# Patient Record
Sex: Male | Born: 1945 | Race: White | Hispanic: No | Marital: Married | Smoking: Current every day smoker
Health system: Southern US, Community
[De-identification: ages and names within clinical notes are randomized; demographics above are authoritative.]

## PROBLEM LIST (undated history)

## (undated) DIAGNOSIS — C679 Malignant neoplasm of bladder, unspecified: Secondary | ICD-10-CM

## (undated) DIAGNOSIS — Z720 Tobacco use: Secondary | ICD-10-CM

## (undated) DIAGNOSIS — J449 Chronic obstructive pulmonary disease, unspecified: Secondary | ICD-10-CM

## (undated) HISTORY — DX: Malignant neoplasm of bladder, unspecified: C67.9

---

## 2009-10-06 ENCOUNTER — Ambulatory Visit (HOSPITAL_COMMUNITY): Admission: RE | Admit: 2009-10-06 | Discharge: 2009-10-07 | Payer: Self-pay | Admitting: Urology

## 2009-10-06 ENCOUNTER — Encounter (INDEPENDENT_AMBULATORY_CARE_PROVIDER_SITE_OTHER): Payer: Self-pay | Admitting: Urology

## 2009-10-16 ENCOUNTER — Ambulatory Visit: Payer: Self-pay | Admitting: Oncology

## 2009-10-29 LAB — CBC WITH DIFFERENTIAL/PLATELET
Eosinophils Absolute: 0.2 10*3/uL (ref 0.0–0.5)
MCV: 92 fL (ref 79.3–98.0)
MONO#: 0.9 10*3/uL (ref 0.1–0.9)
MONO%: 9.3 % (ref 0.0–14.0)
NEUT#: 7.1 10*3/uL — ABNORMAL HIGH (ref 1.5–6.5)
RBC: 4.35 10*6/uL (ref 4.20–5.82)
RDW: 14.5 % (ref 11.0–14.6)
WBC: 9.1 10*3/uL (ref 4.0–10.3)
lymph#: 0.9 10*3/uL (ref 0.9–3.3)

## 2009-10-29 LAB — COMPREHENSIVE METABOLIC PANEL
Albumin: 3.6 g/dL (ref 3.5–5.2)
Alkaline Phosphatase: 78 U/L (ref 39–117)
Glucose, Bld: 105 mg/dL — ABNORMAL HIGH (ref 70–99)
Potassium: 4.5 mEq/L (ref 3.5–5.3)
Sodium: 137 mEq/L (ref 135–145)
Total Protein: 7.4 g/dL (ref 6.0–8.3)

## 2009-11-04 LAB — CBC WITH DIFFERENTIAL/PLATELET
BASO%: 0.3 % (ref 0.0–2.0)
EOS%: 0.9 % (ref 0.0–7.0)
Eosinophils Absolute: 0.1 10*3/uL (ref 0.0–0.5)
MCH: 31.8 pg (ref 27.2–33.4)
MCV: 89.7 fL (ref 79.3–98.0)
MONO%: 10.8 % (ref 0.0–14.0)
NEUT#: 9.5 10*3/uL — ABNORMAL HIGH (ref 1.5–6.5)
RBC: 4.65 10*6/uL (ref 4.20–5.82)
RDW: 14.8 % — ABNORMAL HIGH (ref 11.0–14.6)

## 2009-11-04 LAB — COMPREHENSIVE METABOLIC PANEL
ALT: 9 U/L (ref 0–53)
AST: 14 U/L (ref 0–37)
Albumin: 4.2 g/dL (ref 3.5–5.2)
Alkaline Phosphatase: 80 U/L (ref 39–117)
Potassium: 4.2 mEq/L (ref 3.5–5.3)
Sodium: 131 mEq/L — ABNORMAL LOW (ref 135–145)
Total Protein: 7.3 g/dL (ref 6.0–8.3)

## 2009-11-11 LAB — COMPREHENSIVE METABOLIC PANEL
AST: 20 U/L (ref 0–37)
Albumin: 3.8 g/dL (ref 3.5–5.2)
Alkaline Phosphatase: 73 U/L (ref 39–117)
BUN: 17 mg/dL (ref 6–23)
Potassium: 4 mEq/L (ref 3.5–5.3)
Sodium: 130 mEq/L — ABNORMAL LOW (ref 135–145)
Total Bilirubin: 1 mg/dL (ref 0.3–1.2)

## 2009-11-11 LAB — CBC WITH DIFFERENTIAL/PLATELET
EOS%: 1.8 % (ref 0.0–7.0)
LYMPH%: 15.2 % (ref 14.0–49.0)
MCH: 31.5 pg (ref 27.2–33.4)
MCV: 90.3 fL (ref 79.3–98.0)
MONO%: 3.3 % (ref 0.0–14.0)
RBC: 4.24 10*6/uL (ref 4.20–5.82)
RDW: 14.3 % (ref 11.0–14.6)

## 2009-11-16 ENCOUNTER — Ambulatory Visit: Payer: Self-pay | Admitting: Oncology

## 2009-11-16 ENCOUNTER — Inpatient Hospital Stay (HOSPITAL_COMMUNITY): Admission: EM | Admit: 2009-11-16 | Discharge: 2009-11-20 | Payer: Self-pay | Admitting: Emergency Medicine

## 2009-11-27 ENCOUNTER — Ambulatory Visit: Payer: Self-pay | Admitting: Oncology

## 2009-12-02 LAB — CBC WITH DIFFERENTIAL/PLATELET
BASO%: 2.3 % — ABNORMAL HIGH (ref 0.0–2.0)
Basophils Absolute: 0.2 10*3/uL — ABNORMAL HIGH (ref 0.0–0.1)
EOS%: 0.7 % (ref 0.0–7.0)
HGB: 11.1 g/dL — ABNORMAL LOW (ref 13.0–17.1)
MCH: 31 pg (ref 27.2–33.4)
MONO#: 1.2 10*3/uL — ABNORMAL HIGH (ref 0.1–0.9)
RDW: 14.4 % (ref 11.0–14.6)
WBC: 9 10*3/uL (ref 4.0–10.3)
lymph#: 1.9 10*3/uL (ref 0.9–3.3)

## 2009-12-02 LAB — COMPREHENSIVE METABOLIC PANEL
AST: 18 U/L (ref 0–37)
Albumin: 3.7 g/dL (ref 3.5–5.2)
BUN: 21 mg/dL (ref 6–23)
CO2: 26 mEq/L (ref 19–32)
Calcium: 9 mg/dL (ref 8.4–10.5)
Chloride: 103 mEq/L (ref 96–112)
Creatinine, Ser: 1.56 mg/dL — ABNORMAL HIGH (ref 0.40–1.50)
Glucose, Bld: 86 mg/dL (ref 70–99)
Potassium: 4.7 mEq/L (ref 3.5–5.3)

## 2009-12-09 LAB — COMPREHENSIVE METABOLIC PANEL
Albumin: 3.4 g/dL — ABNORMAL LOW (ref 3.5–5.2)
CO2: 23 mEq/L (ref 19–32)
Calcium: 8.2 mg/dL — ABNORMAL LOW (ref 8.4–10.5)
Glucose, Bld: 70 mg/dL (ref 70–99)
Potassium: 4.4 mEq/L (ref 3.5–5.3)
Sodium: 135 mEq/L (ref 135–145)
Total Protein: 6.6 g/dL (ref 6.0–8.3)

## 2009-12-09 LAB — CBC WITH DIFFERENTIAL/PLATELET
Basophils Absolute: 0.1 10*3/uL (ref 0.0–0.1)
Eosinophils Absolute: 0 10*3/uL (ref 0.0–0.5)
HCT: 25.9 % — ABNORMAL LOW (ref 38.4–49.9)
HGB: 9 g/dL — ABNORMAL LOW (ref 13.0–17.1)
MCV: 88.7 fL (ref 79.3–98.0)
NEUT#: 3.7 10*3/uL (ref 1.5–6.5)
NEUT%: 64.3 % (ref 39.0–75.0)
RDW: 14.5 % (ref 11.0–14.6)
lymph#: 1.3 10*3/uL (ref 0.9–3.3)

## 2009-12-16 ENCOUNTER — Encounter (HOSPITAL_COMMUNITY)
Admission: RE | Admit: 2009-12-16 | Discharge: 2010-03-13 | Payer: Self-pay | Source: Home / Self Care | Attending: Oncology | Admitting: Oncology

## 2009-12-16 LAB — COMPREHENSIVE METABOLIC PANEL
Albumin: 3.5 g/dL (ref 3.5–5.2)
BUN: 17 mg/dL (ref 6–23)
CO2: 22 mEq/L (ref 19–32)
Calcium: 7.9 mg/dL — ABNORMAL LOW (ref 8.4–10.5)
Chloride: 106 mEq/L (ref 96–112)
Creatinine, Ser: 1.34 mg/dL (ref 0.40–1.50)
Glucose, Bld: 75 mg/dL (ref 70–99)

## 2009-12-16 LAB — CBC WITH DIFFERENTIAL/PLATELET
BASO%: 1.1 % (ref 0.0–2.0)
Basophils Absolute: 0 10*3/uL (ref 0.0–0.1)
HCT: 19.9 % — ABNORMAL LOW (ref 38.4–49.9)
HGB: 6.9 g/dL — CL (ref 13.0–17.1)
MONO#: 0.2 10*3/uL (ref 0.1–0.9)
NEUT%: 59.6 % (ref 39.0–75.0)
RDW: 14.7 % — ABNORMAL HIGH (ref 11.0–14.6)
WBC: 2.7 10*3/uL — ABNORMAL LOW (ref 4.0–10.3)
lymph#: 0.9 10*3/uL (ref 0.9–3.3)

## 2009-12-16 LAB — TYPE & CROSSMATCH - CHCC

## 2009-12-29 ENCOUNTER — Ambulatory Visit: Payer: Self-pay | Admitting: Oncology

## 2009-12-30 LAB — CBC WITH DIFFERENTIAL/PLATELET
BASO%: 2.5 % — ABNORMAL HIGH (ref 0.0–2.0)
Eosinophils Absolute: 0.2 10*3/uL (ref 0.0–0.5)
HCT: 33.6 % — ABNORMAL LOW (ref 38.4–49.9)
LYMPH%: 19.1 % (ref 14.0–49.0)
MCHC: 33 g/dL (ref 32.0–36.0)
MONO#: 1.8 10*3/uL — ABNORMAL HIGH (ref 0.1–0.9)
NEUT#: 3.5 10*3/uL (ref 1.5–6.5)
Platelets: 591 10*3/uL — ABNORMAL HIGH (ref 140–400)
RBC: 3.6 10*6/uL — ABNORMAL LOW (ref 4.20–5.82)
WBC: 7.1 10*3/uL (ref 4.0–10.3)
lymph#: 1.4 10*3/uL (ref 0.9–3.3)
nRBC: 0 % (ref 0–0)

## 2009-12-30 LAB — COMPREHENSIVE METABOLIC PANEL
ALT: 11 U/L (ref 0–53)
AST: 15 U/L (ref 0–37)
Albumin: 4.1 g/dL (ref 3.5–5.2)
CO2: 23 mEq/L (ref 19–32)
Calcium: 9.4 mg/dL (ref 8.4–10.5)
Chloride: 104 mEq/L (ref 96–112)
Creatinine, Ser: 1.49 mg/dL (ref 0.40–1.50)
Potassium: 4.8 mEq/L (ref 3.5–5.3)
Total Protein: 7 g/dL (ref 6.0–8.3)

## 2010-01-06 LAB — COMPREHENSIVE METABOLIC PANEL
ALT: 26 U/L (ref 0–53)
AST: 19 U/L (ref 0–37)
Alkaline Phosphatase: 66 U/L (ref 39–117)
Creatinine, Ser: 1.39 mg/dL (ref 0.40–1.50)
Sodium: 135 mEq/L (ref 135–145)
Total Bilirubin: 0.5 mg/dL (ref 0.3–1.2)
Total Protein: 7 g/dL (ref 6.0–8.3)

## 2010-01-06 LAB — CBC WITH DIFFERENTIAL/PLATELET
BASO%: 0.8 % (ref 0.0–2.0)
HCT: 31.7 % — ABNORMAL LOW (ref 38.4–49.9)
LYMPH%: 11.6 % — ABNORMAL LOW (ref 14.0–49.0)
MCH: 30.9 pg (ref 27.2–33.4)
MCHC: 34.1 g/dL (ref 32.0–36.0)
MCV: 90.6 fL (ref 79.3–98.0)
MONO#: 1.5 10*3/uL — ABNORMAL HIGH (ref 0.1–0.9)
MONO%: 13.8 % (ref 0.0–14.0)
NEUT%: 73.6 % (ref 39.0–75.0)
Platelets: 320 10*3/uL (ref 140–400)
RBC: 3.5 10*6/uL — ABNORMAL LOW (ref 4.20–5.82)
WBC: 10.7 10*3/uL — ABNORMAL HIGH (ref 4.0–10.3)
nRBC: 0 % (ref 0–0)

## 2010-01-13 LAB — CBC WITH DIFFERENTIAL/PLATELET
Basophils Absolute: 0.1 10*3/uL (ref 0.0–0.1)
EOS%: 0.3 % (ref 0.0–7.0)
Eosinophils Absolute: 0 10*3/uL (ref 0.0–0.5)
HGB: 8.4 g/dL — ABNORMAL LOW (ref 13.0–17.1)
LYMPH%: 34.3 % (ref 14.0–49.0)
MCH: 29.8 pg (ref 27.2–33.4)
MCV: 87.2 fL (ref 79.3–98.0)
MONO%: 14.6 % — ABNORMAL HIGH (ref 0.0–14.0)
NEUT%: 47.9 % (ref 39.0–75.0)
Platelets: 65 10*3/uL — ABNORMAL LOW (ref 140–400)
RDW: 15.5 % — ABNORMAL HIGH (ref 11.0–14.6)

## 2010-02-06 ENCOUNTER — Ambulatory Visit: Payer: Self-pay | Admitting: Oncology

## 2010-02-10 LAB — COMPREHENSIVE METABOLIC PANEL
ALT: 8 U/L (ref 0–53)
Alkaline Phosphatase: 65 U/L (ref 39–117)
CO2: 22 mEq/L (ref 19–32)
Creatinine, Ser: 1.46 mg/dL (ref 0.40–1.50)
Sodium: 136 mEq/L (ref 135–145)
Total Bilirubin: 0.6 mg/dL (ref 0.3–1.2)
Total Protein: 6.7 g/dL (ref 6.0–8.3)

## 2010-02-10 LAB — CBC WITH DIFFERENTIAL/PLATELET
BASO%: 0.4 % (ref 0.0–2.0)
EOS%: 0.6 % (ref 0.0–7.0)
HCT: 30 % — ABNORMAL LOW (ref 38.4–49.9)
LYMPH%: 6.2 % — ABNORMAL LOW (ref 14.0–49.0)
MCH: 31.7 pg (ref 27.2–33.4)
MCHC: 34.5 g/dL (ref 32.0–36.0)
MCV: 91.9 fL (ref 79.3–98.0)
MONO%: 12.5 % (ref 0.0–14.0)
NEUT%: 80.3 % — ABNORMAL HIGH (ref 39.0–75.0)
Platelets: 363 10*3/uL (ref 140–400)
RBC: 3.27 10*6/uL — ABNORMAL LOW (ref 4.20–5.82)
WBC: 11.5 10*3/uL — ABNORMAL HIGH (ref 4.0–10.3)

## 2010-02-23 ENCOUNTER — Inpatient Hospital Stay (HOSPITAL_COMMUNITY)
Admission: RE | Admit: 2010-02-23 | Discharge: 2010-03-01 | Payer: Self-pay | Source: Home / Self Care | Attending: Urology | Admitting: Urology

## 2010-02-23 ENCOUNTER — Encounter (INDEPENDENT_AMBULATORY_CARE_PROVIDER_SITE_OTHER): Payer: Self-pay | Admitting: Urology

## 2010-03-25 ENCOUNTER — Ambulatory Visit: Payer: Self-pay | Admitting: Oncology

## 2010-03-25 ENCOUNTER — Encounter
Admission: RE | Admit: 2010-03-25 | Discharge: 2010-03-25 | Payer: Self-pay | Source: Home / Self Care | Attending: Urology | Admitting: Urology

## 2010-03-27 LAB — COMPREHENSIVE METABOLIC PANEL
ALT: <8 U/L (ref 0–53)
AST: 10 U/L (ref 0–37)
Albumin: 3.8 g/dL (ref 3.5–5.2)
Alkaline Phosphatase: 63 U/L (ref 39–117)
BUN: 22 mg/dL (ref 6–23)
CO2: 21 mEq/L (ref 19–32)
Calcium: 9 mg/dL (ref 8.4–10.5)
Chloride: 107 mEq/L (ref 96–112)
Creatinine, Ser: 1.42 mg/dL (ref 0.40–1.50)
Glucose, Bld: 86 mg/dL (ref 70–99)
Potassium: 4.5 mEq/L (ref 3.5–5.3)
Sodium: 138 mEq/L (ref 135–145)
Total Bilirubin: 0.3 mg/dL (ref 0.3–1.2)
Total Protein: 7 g/dL (ref 6.0–8.3)

## 2010-03-27 LAB — CBC WITH DIFFERENTIAL/PLATELET
BASO%: 0.5 % (ref 0.0–2.0)
Basophils Absolute: 0 10*3/uL (ref 0.0–0.1)
EOS%: 2.6 % (ref 0.0–7.0)
Eosinophils Absolute: 0.2 10*3/uL (ref 0.0–0.5)
HCT: 30.6 % — ABNORMAL LOW (ref 38.4–49.9)
HGB: 10.4 g/dL — ABNORMAL LOW (ref 13.0–17.1)
LYMPH%: 11.3 % — ABNORMAL LOW (ref 14.0–49.0)
MCH: 28.7 pg (ref 27.2–33.4)
MCHC: 33.8 g/dL (ref 32.0–36.0)
MCV: 85.1 fL (ref 79.3–98.0)
MONO#: 0.7 10*3/uL (ref 0.1–0.9)
MONO%: 9.5 % (ref 0.0–14.0)
NEUT#: 5.3 10*3/uL (ref 1.5–6.5)
NEUT%: 76.1 % — ABNORMAL HIGH (ref 39.0–75.0)
Platelets: 344 10*3/uL (ref 140–400)
RBC: 3.6 10*6/uL — ABNORMAL LOW (ref 4.20–5.82)
RDW: 15 % — ABNORMAL HIGH (ref 11.0–14.6)
WBC: 6.9 10*3/uL (ref 4.0–10.3)
lymph#: 0.8 10*3/uL — ABNORMAL LOW (ref 0.9–3.3)

## 2010-03-28 ENCOUNTER — Ambulatory Visit (HOSPITAL_COMMUNITY)
Admission: RE | Admit: 2010-03-28 | Discharge: 2010-03-28 | Payer: Self-pay | Source: Home / Self Care | Attending: Interventional Radiology | Admitting: Interventional Radiology

## 2010-04-04 ENCOUNTER — Other Ambulatory Visit: Payer: Self-pay | Admitting: Oncology

## 2010-04-04 ENCOUNTER — Encounter: Payer: Self-pay | Admitting: Oncology

## 2010-04-04 DIAGNOSIS — C679 Malignant neoplasm of bladder, unspecified: Secondary | ICD-10-CM

## 2010-04-09 ENCOUNTER — Other Ambulatory Visit (HOSPITAL_COMMUNITY): Payer: Self-pay | Admitting: Urology

## 2010-04-09 DIAGNOSIS — C679 Malignant neoplasm of bladder, unspecified: Secondary | ICD-10-CM

## 2010-04-14 ENCOUNTER — Other Ambulatory Visit: Payer: Self-pay | Admitting: Interventional Radiology

## 2010-04-14 DIAGNOSIS — N2889 Other specified disorders of kidney and ureter: Secondary | ICD-10-CM

## 2010-04-20 ENCOUNTER — Other Ambulatory Visit (HOSPITAL_COMMUNITY): Payer: Self-pay

## 2010-04-20 NOTE — Discharge Summary (Signed)
  NAMEKLAUS, Perez                ACCOUNT NO.:  [id number]  MEDICAL RECORD NO.:  [id number]          PATIENT TYPE:  INP  LOCATION:  1435                         FACILITY:  [facility]  PHYSICIAN:  Troy Perez, M.D.  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  PROCEDURE:  Was radical cystoprostatectomy with bilateral pelvic lymph node dissection and ileal loop urinary diversion on February 23, 2010.  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  He was 65 years of age at the time of diagnosis.  The patient had previous problems with urinary retention and also had some mild bilateral hydronephrosis.  His tumor was felt to be advanced and that he might benefit from neoadjuvant chemotherapy which he completed a course of.  Followup imaging did not reveal any evidence of gross metastatic disease or definitive evidence of disease outside his bladder.  He was told to be a candidate for radical cystoprostatectomy.  That procedure was performed on February 23, 2010.  The procedure itself was uncomplicated.  The patient did well and had no immediate issues.  The patient's postoperative course was also relatively uneventful. Postoperative hemoglobin was 9.9.  Renal function was stable.  The patient had a continued ongoing ileus, for this procedure.  The patient's hemoglobin stabilized approximately at 9.2.  The patient was able to be discharged on postoperative day #6.  At that point, his Jackson-Pratt drain had been removed.  He was tolerating a general diet well and having normal bowel activity.  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  The tumor did invade to the muscularis propria and into perivesical soft tissue.  The tumor also involved the prostatic urethra and prostatic gland stroma.  There was no evidence of gross disease with lymph nodes.  DISPOSITION:  The patient was discharged to home.  Home health care will be arranged for teaching with regard to ostomy care.  He will be sent home on oral pain medication and will be instructed to otherwise continue with his current medications.  Followup in our office will be in approximately 7-10 days for staple removal, consideration of the stent removal and clinical reassessment.     Troy Perez, M.D.     DSG/MEDQ  D:  04/06/2010  T:  04/06/2010  Job:  [id number]  Electronically Signed by Barron Alvine M.D. on 04/20/2010 08:14:10 AM

## 2010-04-21 ENCOUNTER — Encounter (HOSPITAL_COMMUNITY)
Admission: RE | Admit: 2010-04-21 | Discharge: 2010-04-21 | Disposition: A | Discharge status: Home / Self Care | Payer: MEDICARE | Source: Ambulatory Visit | Attending: Urology | Admitting: Urology

## 2010-04-21 ENCOUNTER — Encounter (HOSPITAL_COMMUNITY): Payer: Self-pay

## 2010-04-21 ENCOUNTER — Ambulatory Visit (HOSPITAL_COMMUNITY)
Admission: RE | Admit: 2010-04-21 | Discharge: 2010-04-21 | Disposition: A | Discharge status: Home / Self Care | Payer: MEDICARE | Source: Ambulatory Visit | Attending: Urology | Admitting: Urology

## 2010-04-21 DIAGNOSIS — Z932 Ileostomy status: Secondary | ICD-10-CM | POA: Insufficient documentation

## 2010-04-21 DIAGNOSIS — C679 Malignant neoplasm of bladder, unspecified: Secondary | ICD-10-CM | POA: Insufficient documentation

## 2010-04-21 MED ORDER — TECHNETIUM TC 99M MEDRONATE IV KIT: 23.2000 | PACK | Freq: Once | INTRAVENOUS | Status: DC | PRN

## 2010-04-22 ENCOUNTER — Encounter (HOSPITAL_COMMUNITY): Payer: MEDICARE | Attending: Interventional Radiology

## 2010-04-22 DIAGNOSIS — Z01812 Encounter for preprocedural laboratory examination: Secondary | ICD-10-CM | POA: Insufficient documentation

## 2010-04-22 LAB — CROSSMATCH: ABO/RH(D): A POS

## 2010-04-22 LAB — BASIC METABOLIC PANEL
Calcium: 9.3 mg/dL (ref 8.4–10.5)
GFR calc Af Amer: >60 mL/min (ref >60)
GFR calc non Af Amer: 50 mL/min — ABNORMAL LOW (ref >60)
Glucose, Bld: 99 mg/dL (ref 70–99)
Potassium: 4.6 mEq/L (ref 3.5–5.1)
Sodium: 139 mEq/L (ref 135–145)

## 2010-04-22 LAB — CBC
Hemoglobin: 12.8 g/dL — ABNORMAL LOW (ref 13.0–17.0)
MCH: 28.4 pg (ref 26.0–34.0)
MCHC: 33 g/dL (ref 30.0–36.0)
MCV: 86 fL (ref 78.0–100.0)
Platelets: 245 10*3/uL (ref 150–400)
RBC: 4.51 MIL/uL (ref 4.22–5.81)

## 2010-04-22 LAB — PROTIME-INR
INR: 1.03 (ref 0.00–1.49)
Prothrombin Time: 13.7 seconds (ref 11.6–15.2)

## 2010-04-24 ENCOUNTER — Other Ambulatory Visit: Payer: Self-pay | Admitting: Interventional Radiology

## 2010-04-24 ENCOUNTER — Observation Stay (HOSPITAL_COMMUNITY)
Admission: RE | Admit: 2010-04-24 | Discharge: 2010-04-25 | Disposition: A | Discharge status: Home / Self Care | Payer: MEDICARE | Source: Ambulatory Visit | Attending: Interventional Radiology | Admitting: Interventional Radiology

## 2010-04-24 ENCOUNTER — Ambulatory Visit (HOSPITAL_COMMUNITY): Payer: MEDICARE

## 2010-04-24 DIAGNOSIS — J4489 Other specified chronic obstructive pulmonary disease: Secondary | ICD-10-CM | POA: Insufficient documentation

## 2010-04-24 DIAGNOSIS — Z79899 Other long term (current) drug therapy: Secondary | ICD-10-CM | POA: Insufficient documentation

## 2010-04-24 DIAGNOSIS — F17200 Nicotine dependence, unspecified, uncomplicated: Secondary | ICD-10-CM | POA: Insufficient documentation

## 2010-04-24 DIAGNOSIS — N2889 Other specified disorders of kidney and ureter: Secondary | ICD-10-CM

## 2010-04-24 DIAGNOSIS — J449 Chronic obstructive pulmonary disease, unspecified: Secondary | ICD-10-CM | POA: Insufficient documentation

## 2010-04-24 DIAGNOSIS — Z01812 Encounter for preprocedural laboratory examination: Secondary | ICD-10-CM | POA: Insufficient documentation

## 2010-04-24 DIAGNOSIS — N189 Chronic kidney disease, unspecified: Secondary | ICD-10-CM | POA: Insufficient documentation

## 2010-04-24 DIAGNOSIS — Z8551 Personal history of malignant neoplasm of bladder: Secondary | ICD-10-CM | POA: Insufficient documentation

## 2010-04-24 DIAGNOSIS — N269 Renal sclerosis, unspecified: Secondary | ICD-10-CM | POA: Insufficient documentation

## 2010-04-24 DIAGNOSIS — N059 Unspecified nephritic syndrome with unspecified morphologic changes: Principal | ICD-10-CM | POA: Insufficient documentation

## 2010-04-24 MED ORDER — IOHEXOL 300 MG/ML  SOLN: 50.0000 mL | Freq: Once | INTRAMUSCULAR | Status: AC | PRN

## 2010-04-25 ENCOUNTER — Other Ambulatory Visit (HOSPITAL_COMMUNITY): Payer: Self-pay

## 2010-04-25 LAB — BASIC METABOLIC PANEL
BUN: 21 mg/dL (ref 6–23)
CO2: 26 mEq/L (ref 19–32)
Calcium: 8.7 mg/dL (ref 8.4–10.5)
Chloride: 106 mEq/L (ref 96–112)
Creatinine, Ser: 1.41 mg/dL (ref 0.4–1.5)
GFR calc Af Amer: >60 mL/min (ref >60)
GFR calc non Af Amer: 50 mL/min — ABNORMAL LOW (ref >60)
Glucose, Bld: 134 mg/dL — ABNORMAL HIGH (ref 70–99)
Potassium: 4.7 mEq/L (ref 3.5–5.1)
Sodium: 138 mEq/L (ref 135–145)

## 2010-04-25 LAB — CBC
HCT: 32.8 % — ABNORMAL LOW (ref 39.0–52.0)
Hemoglobin: 10.8 g/dL — ABNORMAL LOW (ref 13.0–17.0)
MCH: 28 pg (ref 26.0–34.0)
MCHC: 32.9 g/dL (ref 30.0–36.0)
MCV: 85 fL (ref 78.0–100.0)
Platelets: 203 10*3/uL (ref 150–400)
RBC: 3.86 MIL/uL — ABNORMAL LOW (ref 4.22–5.81)
RDW: 14.4 % (ref 11.5–15.5)
WBC: 8.2 10*3/uL (ref 4.0–10.5)

## 2010-05-06 ENCOUNTER — Other Ambulatory Visit: Payer: Self-pay | Admitting: Interventional Radiology

## 2010-05-06 ENCOUNTER — Other Ambulatory Visit: Payer: Self-pay | Admitting: Oncology

## 2010-05-06 ENCOUNTER — Other Ambulatory Visit (HOSPITAL_COMMUNITY): Payer: Self-pay | Admitting: Interventional Radiology

## 2010-05-06 DIAGNOSIS — N2889 Other specified disorders of kidney and ureter: Secondary | ICD-10-CM

## 2010-05-06 DIAGNOSIS — C679 Malignant neoplasm of bladder, unspecified: Secondary | ICD-10-CM

## 2010-05-25 ENCOUNTER — Other Ambulatory Visit (HOSPITAL_COMMUNITY): Payer: MEDICARE

## 2010-05-25 ENCOUNTER — Other Ambulatory Visit (HOSPITAL_COMMUNITY): Payer: Self-pay

## 2010-05-25 ENCOUNTER — Ambulatory Visit (HOSPITAL_COMMUNITY)
Admission: RE | Admit: 2010-05-25 | Discharge: 2010-05-25 | Disposition: A | Discharge status: Home / Self Care | Payer: MEDICARE | Source: Ambulatory Visit | Attending: Interventional Radiology | Admitting: Interventional Radiology

## 2010-05-25 ENCOUNTER — Other Ambulatory Visit: Payer: Self-pay | Admitting: Oncology

## 2010-05-25 ENCOUNTER — Encounter (HOSPITAL_BASED_OUTPATIENT_CLINIC_OR_DEPARTMENT_OTHER): Payer: Medicare Other | Admitting: Oncology

## 2010-05-25 DIAGNOSIS — C679 Malignant neoplasm of bladder, unspecified: Secondary | ICD-10-CM

## 2010-05-25 DIAGNOSIS — J438 Other emphysema: Secondary | ICD-10-CM | POA: Insufficient documentation

## 2010-05-25 DIAGNOSIS — N289 Disorder of kidney and ureter, unspecified: Secondary | ICD-10-CM | POA: Insufficient documentation

## 2010-05-25 LAB — CBC
HCT: 25.9 % — ABNORMAL LOW (ref 39.0–52.0)
MCH: 29 pg (ref 26.0–34.0)
MCH: 29.5 pg (ref 26.0–34.0)
MCHC: 33 g/dL (ref 30.0–36.0)
MCHC: 33.6 g/dL (ref 30.0–36.0)
Platelets: 187 10*3/uL (ref 150–400)
RDW: 15 % (ref 11.5–15.5)

## 2010-05-25 LAB — CMP (CANCER CENTER ONLY)
Alkaline Phosphatase: 63 U/L (ref 26–84)
CO2: 27 mEq/L (ref 18–33)
Creat: 1.3 mg/dl — ABNORMAL HIGH (ref 0.6–1.2)
Glucose, Bld: 97 mg/dL (ref 73–118)
Sodium: 138 mEq/L (ref 128–145)
Total Bilirubin: 0.5 mg/dl (ref 0.20–1.60)

## 2010-05-25 LAB — GLUCOSE, CAPILLARY

## 2010-05-25 LAB — CBC WITH DIFFERENTIAL/PLATELET
BASO%: 1.1 % (ref 0.0–2.0)
Eosinophils Absolute: 0.2 10*3/uL (ref 0.0–0.5)
HCT: 37.1 % — ABNORMAL LOW (ref 38.4–49.9)
LYMPH%: 15.8 % (ref 14.0–49.0)
MCHC: 34.2 g/dL (ref 32.0–36.0)
MCV: 83.3 fL (ref 79.3–98.0)
MONO#: 0.6 10*3/uL (ref 0.1–0.9)
MONO%: 11.8 % (ref 0.0–14.0)
NEUT%: 67.3 % (ref 39.0–75.0)
Platelets: 307 10*3/uL (ref 140–400)
RBC: 4.46 10*6/uL (ref 4.20–5.82)
WBC: 5.4 10*3/uL (ref 4.0–10.3)

## 2010-05-25 LAB — BASIC METABOLIC PANEL
BUN: 14 mg/dL (ref 6–23)
CO2: 21 mEq/L (ref 19–32)
CO2: 24 mEq/L (ref 19–32)
Calcium: 8.4 mg/dL (ref 8.4–10.5)
Chloride: 106 mEq/L (ref 96–112)
GFR calc Af Amer: 60 mL/min — ABNORMAL LOW (ref >60)
GFR calc non Af Amer: 46 mL/min — ABNORMAL LOW (ref >60)
Glucose, Bld: 139 mg/dL — ABNORMAL HIGH (ref 70–99)
Glucose, Bld: 147 mg/dL — ABNORMAL HIGH (ref 70–99)
Potassium: 4.1 mEq/L (ref 3.5–5.1)
Potassium: 4.7 mEq/L (ref 3.5–5.1)
Sodium: 134 mEq/L — ABNORMAL LOW (ref 135–145)

## 2010-05-25 LAB — CREATININE, FLUID (PLEURAL, PERITONEAL, JP DRAINAGE): Creat, Fluid: 1.4 mg/dL

## 2010-05-25 MED ORDER — IOHEXOL 300 MG/ML  SOLN
100.0000 mL | Freq: Once | INTRAMUSCULAR | Status: AC | PRN
  Administered 2010-05-25: 100 mL via INTRAVENOUS

## 2010-05-26 ENCOUNTER — Encounter (HOSPITAL_BASED_OUTPATIENT_CLINIC_OR_DEPARTMENT_OTHER): Payer: MEDICARE | Admitting: Oncology

## 2010-05-26 DIAGNOSIS — N289 Disorder of kidney and ureter, unspecified: Secondary | ICD-10-CM

## 2010-05-26 DIAGNOSIS — T451X5A Adverse effect of antineoplastic and immunosuppressive drugs, initial encounter: Secondary | ICD-10-CM

## 2010-05-26 DIAGNOSIS — D6481 Anemia due to antineoplastic chemotherapy: Secondary | ICD-10-CM

## 2010-05-26 DIAGNOSIS — F99 Mental disorder, not otherwise specified: Secondary | ICD-10-CM

## 2010-05-26 DIAGNOSIS — C679 Malignant neoplasm of bladder, unspecified: Secondary | ICD-10-CM

## 2010-05-26 LAB — CBC
HCT: 31.3 % — ABNORMAL LOW (ref 39.0–52.0)
HCT: 31.7 % — ABNORMAL LOW (ref 39.0–52.0)
Hemoglobin: 10.5 g/dL — ABNORMAL LOW (ref 13.0–17.0)
Hemoglobin: 10.5 g/dL — ABNORMAL LOW (ref 13.0–17.0)
MCH: 29.6 pg (ref 26.0–34.0)
MCH: 29.7 pg (ref 26.0–34.0)
MCHC: 33.2 g/dL (ref 30.0–36.0)
MCHC: 33.5 g/dL (ref 30.0–36.0)
MCV: 89.1 fL (ref 78.0–100.0)
MCV: 91.6 fL (ref 78.0–100.0)
Platelets: 156 10*3/uL (ref 150–400)
Platelets: 176 10*3/uL (ref 150–400)
RBC: 3.39 MIL/uL — ABNORMAL LOW (ref 4.22–5.81)
RBC: 3.46 MIL/uL — ABNORMAL LOW (ref 4.22–5.81)
RDW: 15.7 % — ABNORMAL HIGH (ref 11.5–15.5)
RDW: 16 % — ABNORMAL HIGH (ref 11.5–15.5)
RDW: 16.4 % — ABNORMAL HIGH (ref 11.5–15.5)
RDW: 16.7 % — ABNORMAL HIGH (ref 11.5–15.5)
WBC: 16.1 10*3/uL — ABNORMAL HIGH (ref 4.0–10.5)
WBC: 8.4 10*3/uL (ref 4.0–10.5)

## 2010-05-26 LAB — BASIC METABOLIC PANEL
BUN: 15 mg/dL (ref 6–23)
BUN: 19 mg/dL (ref 6–23)
CO2: 22 mEq/L (ref 19–32)
CO2: 25 mEq/L (ref 19–32)
Calcium: 7.6 mg/dL — ABNORMAL LOW (ref 8.4–10.5)
Calcium: 8 mg/dL — ABNORMAL LOW (ref 8.4–10.5)
Creatinine, Ser: 1.58 mg/dL — ABNORMAL HIGH (ref 0.4–1.5)
GFR calc non Af Amer: 39 mL/min — ABNORMAL LOW (ref >60)
GFR calc non Af Amer: 44 mL/min — ABNORMAL LOW (ref >60)
Glucose, Bld: 141 mg/dL — ABNORMAL HIGH (ref 70–99)
Glucose, Bld: 159 mg/dL — ABNORMAL HIGH (ref 70–99)
Potassium: 4.4 mEq/L (ref 3.5–5.1)
Sodium: 135 mEq/L (ref 135–145)

## 2010-05-26 LAB — COMPREHENSIVE METABOLIC PANEL
ALT: 18 U/L (ref 0–53)
Alkaline Phosphatase: 70 U/L (ref 39–117)
BUN: 25 mg/dL — ABNORMAL HIGH (ref 6–23)
CO2: 27 mEq/L (ref 19–32)
Chloride: 104 mEq/L (ref 96–112)
GFR calc non Af Amer: 38 mL/min — ABNORMAL LOW (ref >60)
Glucose, Bld: 90 mg/dL (ref 70–99)
Potassium: 4.5 mEq/L (ref 3.5–5.1)
Sodium: 140 mEq/L (ref 135–145)
Total Bilirubin: 0.5 mg/dL (ref 0.3–1.2)

## 2010-05-26 LAB — POCT I-STAT 4, (NA,K, GLUC, HGB,HCT)
Glucose, Bld: 138 mg/dL — ABNORMAL HIGH (ref 70–99)
HCT: 22 % — ABNORMAL LOW (ref 39.0–52.0)
HCT: 25 % — ABNORMAL LOW (ref 39.0–52.0)
Hemoglobin: 7.5 g/dL — ABNORMAL LOW (ref 13.0–17.0)
Sodium: 138 mEq/L (ref 135–145)
Sodium: 138 mEq/L (ref 135–145)

## 2010-05-26 LAB — CROSSMATCH
ABO/RH(D): A POS
Antibody Screen: NEGATIVE
Unit division: 0

## 2010-05-28 LAB — DIFFERENTIAL
Band Neutrophils: 0 % (ref 0–10)
Basophils Absolute: 0 10*3/uL (ref 0.0–0.1)
Basophils Absolute: 0 10*3/uL (ref 0.0–0.1)
Basophils Absolute: 0 10*3/uL (ref 0.0–0.1)
Basophils Absolute: 0 10*3/uL (ref 0.0–0.1)
Basophils Relative: 0 % (ref 0–1)
Basophils Relative: 0 % (ref 0–1)
Eosinophils Relative: 0 % (ref 0–5)
Eosinophils Relative: 1 % (ref 0–5)
Eosinophils Relative: 1 % (ref 0–5)
Lymphocytes Relative: 23 % (ref 12–46)
Lymphocytes Relative: 7 % — ABNORMAL LOW (ref 12–46)
Lymphocytes Relative: 9 % — ABNORMAL LOW (ref 12–46)
Lymphocytes Relative: 9 % — ABNORMAL LOW (ref 12–46)
Lymphs Abs: 0.1 10*3/uL — ABNORMAL LOW (ref 0.7–4.0)
Lymphs Abs: 0.2 10*3/uL — ABNORMAL LOW (ref 0.7–4.0)
Lymphs Abs: 0.3 10*3/uL — ABNORMAL LOW (ref 0.7–4.0)
Lymphs Abs: 0.8 10*3/uL (ref 0.7–4.0)
Monocytes Absolute: 0.1 10*3/uL (ref 0.1–1.0)
Monocytes Absolute: 0.5 10*3/uL (ref 0.1–1.0)
Monocytes Relative: 13 % — ABNORMAL HIGH (ref 3–12)
Neutro Abs: 2.2 10*3/uL (ref 1.7–7.7)
Neutro Abs: 2.4 10*3/uL (ref 1.7–7.7)
Neutro Abs: 2.5 10*3/uL (ref 1.7–7.7)
Neutrophils Relative %: 64 % (ref 43–77)
Neutrophils Relative %: 75 % (ref 43–77)
Neutrophils Relative %: 86 % — ABNORMAL HIGH (ref 43–77)
Promyelocytes Absolute: 0 %
WBC Morphology: INCREASED

## 2010-05-28 LAB — CBC
HCT: 24 % — ABNORMAL LOW (ref 39.0–52.0)
HCT: 24.8 % — ABNORMAL LOW (ref 39.0–52.0)
HCT: 27.1 % — ABNORMAL LOW (ref 39.0–52.0)
Hemoglobin: 8.5 g/dL — ABNORMAL LOW (ref 13.0–17.0)
Hemoglobin: 8.7 g/dL — ABNORMAL LOW (ref 13.0–17.0)
Hemoglobin: 9.7 g/dL — ABNORMAL LOW (ref 13.0–17.0)
MCH: 32.5 pg (ref 26.0–34.0)
MCHC: 35.7 g/dL (ref 30.0–36.0)
MCV: 90.2 fL (ref 78.0–100.0)
MCV: 91.4 fL (ref 78.0–100.0)
MCV: 92.1 fL (ref 78.0–100.0)
MCV: 92.4 fL (ref 78.0–100.0)
MCV: 92.8 fL (ref 78.0–100.0)
Platelets: 107 10*3/uL — ABNORMAL LOW (ref 150–400)
Platelets: 46 10*3/uL — ABNORMAL LOW (ref 150–400)
RBC: 2.57 MIL/uL — ABNORMAL LOW (ref 4.22–5.81)
RBC: 2.6 MIL/uL — ABNORMAL LOW (ref 4.22–5.81)
RBC: 2.68 MIL/uL — ABNORMAL LOW (ref 4.22–5.81)
RBC: 2.97 MIL/uL — ABNORMAL LOW (ref 4.22–5.81)
RDW: 14.5 % (ref 11.5–15.5)
RDW: 14.6 % (ref 11.5–15.5)
WBC: 2.2 10*3/uL — ABNORMAL LOW (ref 4.0–10.5)
WBC: 3.3 10*3/uL — ABNORMAL LOW (ref 4.0–10.5)
WBC: 3.5 10*3/uL — ABNORMAL LOW (ref 4.0–10.5)

## 2010-05-28 LAB — COMPREHENSIVE METABOLIC PANEL
ALT: 45 U/L (ref 0–53)
AST: 52 U/L — ABNORMAL HIGH (ref 0–37)
Alkaline Phosphatase: 52 U/L (ref 39–117)
Alkaline Phosphatase: 61 U/L (ref 39–117)
BUN: 37 mg/dL — ABNORMAL HIGH (ref 6–23)
CO2: 20 mEq/L (ref 19–32)
CO2: 22 mEq/L (ref 19–32)
Calcium: 8.3 mg/dL — ABNORMAL LOW (ref 8.4–10.5)
Chloride: 105 mEq/L (ref 96–112)
Creatinine, Ser: 1.83 mg/dL — ABNORMAL HIGH (ref 0.4–1.5)
GFR calc non Af Amer: 29 mL/min — ABNORMAL LOW (ref >60)
GFR calc non Af Amer: 37 mL/min — ABNORMAL LOW (ref >60)
Glucose, Bld: 226 mg/dL — ABNORMAL HIGH (ref 70–99)
Potassium: 4.2 mEq/L (ref 3.5–5.1)
Sodium: 130 mEq/L — ABNORMAL LOW (ref 135–145)
Total Bilirubin: 0.7 mg/dL (ref 0.3–1.2)

## 2010-05-28 LAB — URINALYSIS, ROUTINE W REFLEX MICROSCOPIC
Ketones, ur: NEGATIVE mg/dL
Nitrite: NEGATIVE
Protein, ur: 100 mg/dL — AB
Urobilinogen, UA: 1 mg/dL (ref 0.0–1.0)

## 2010-05-28 LAB — BASIC METABOLIC PANEL
Chloride: 100 mEq/L (ref 96–112)
Chloride: 110 mEq/L (ref 96–112)
Chloride: 112 mEq/L (ref 96–112)
Creatinine, Ser: 1.59 mg/dL — ABNORMAL HIGH (ref 0.4–1.5)
Creatinine, Ser: 2.02 mg/dL — ABNORMAL HIGH (ref 0.4–1.5)
GFR calc Af Amer: 40 mL/min — ABNORMAL LOW (ref >60)
GFR calc Af Amer: 53 mL/min — ABNORMAL LOW (ref >60)
GFR calc Af Amer: 55 mL/min — ABNORMAL LOW (ref >60)
GFR calc non Af Amer: 44 mL/min — ABNORMAL LOW (ref >60)
GFR calc non Af Amer: 45 mL/min — ABNORMAL LOW (ref >60)
Potassium: 3.7 mEq/L (ref 3.5–5.1)
Sodium: 139 mEq/L (ref 135–145)

## 2010-05-28 LAB — MAGNESIUM: Magnesium: 2.1 mg/dL (ref 1.5–2.5)

## 2010-05-28 LAB — EXPECTORATED SPUTUM ASSESSMENT W GRAM STAIN, RFLX TO RESP C

## 2010-05-28 LAB — CROSSMATCH
ABO/RH(D): A POS
Antibody Screen: NEGATIVE

## 2010-05-28 LAB — CULTURE, BLOOD (ROUTINE X 2)

## 2010-05-28 LAB — URINE MICROSCOPIC-ADD ON

## 2010-05-28 LAB — CULTURE, RESPIRATORY W GRAM STAIN

## 2010-05-28 LAB — ABO/RH: ABO/RH(D): A POS

## 2010-05-30 LAB — BASIC METABOLIC PANEL
BUN: 12 mg/dL (ref 6–23)
BUN: 14 mg/dL (ref 6–23)
CO2: 28 mEq/L (ref 19–32)
Calcium: 8.5 mg/dL (ref 8.4–10.5)
Chloride: 107 mEq/L (ref 96–112)
Chloride: 108 mEq/L (ref 96–112)
Creatinine, Ser: 1.5 mg/dL (ref 0.4–1.5)
Creatinine, Ser: 1.72 mg/dL — ABNORMAL HIGH (ref 0.4–1.5)
Glucose, Bld: 123 mg/dL — ABNORMAL HIGH (ref 70–99)
Glucose, Bld: 91 mg/dL (ref 70–99)
Potassium: 4.4 mEq/L (ref 3.5–5.1)

## 2010-05-30 LAB — CBC
HCT: 38.6 % — ABNORMAL LOW (ref 39.0–52.0)
MCHC: 35.5 g/dL (ref 30.0–36.0)
MCV: 91.3 fL (ref 78.0–100.0)
Platelets: 353 10*3/uL (ref 150–400)
RDW: 13.2 % (ref 11.5–15.5)

## 2010-06-03 ENCOUNTER — Other Ambulatory Visit: Payer: MEDICARE

## 2010-06-03 ENCOUNTER — Ambulatory Visit
Admission: RE | Admit: 2010-06-03 | Discharge: 2010-06-03 | Disposition: A | Discharge status: Home / Self Care | Payer: Medicare Other | Source: Ambulatory Visit | Attending: Interventional Radiology | Admitting: Interventional Radiology

## 2010-06-03 VITALS — BP 105/62 | HR 77 | Temp 98.7°F | Resp 18 | Ht 67.0 in | Wt 138.0 lb

## 2010-06-03 DIAGNOSIS — N2889 Other specified disorders of kidney and ureter: Secondary | ICD-10-CM

## 2010-06-03 NOTE — Progress Notes (Signed)
Pt states that he is doing well.  Experienced some right flank discomfort x few days immediately post RFA.  Denies pain since.  Has resumed ADL's, including golf 2-3 times/week, also has been doing yard work (1 acre in size).   Afebrile.  Denies hematuria.

## 2010-09-02 ENCOUNTER — Encounter (HOSPITAL_BASED_OUTPATIENT_CLINIC_OR_DEPARTMENT_OTHER): Payer: Medicare Other | Admitting: Oncology

## 2010-09-02 ENCOUNTER — Other Ambulatory Visit: Payer: Self-pay | Admitting: Oncology

## 2010-09-02 DIAGNOSIS — N289 Disorder of kidney and ureter, unspecified: Secondary | ICD-10-CM

## 2010-09-02 DIAGNOSIS — T451X5A Adverse effect of antineoplastic and immunosuppressive drugs, initial encounter: Secondary | ICD-10-CM

## 2010-09-02 DIAGNOSIS — F99 Mental disorder, not otherwise specified: Secondary | ICD-10-CM

## 2010-09-02 DIAGNOSIS — C679 Malignant neoplasm of bladder, unspecified: Secondary | ICD-10-CM

## 2010-09-02 DIAGNOSIS — D6481 Anemia due to antineoplastic chemotherapy: Secondary | ICD-10-CM

## 2010-09-02 LAB — COMPREHENSIVE METABOLIC PANEL
AST: 13 U/L (ref 0–37)
Alkaline Phosphatase: 68 U/L (ref 39–117)
Glucose, Bld: 91 mg/dL (ref 70–99)
Sodium: 137 mEq/L (ref 135–145)
Total Bilirubin: 0.5 mg/dL (ref 0.3–1.2)
Total Protein: 7 g/dL (ref 6.0–8.3)

## 2010-09-02 LAB — CBC WITH DIFFERENTIAL/PLATELET
BASO%: 0.7 % (ref 0.0–2.0)
EOS%: 4.6 % (ref 0.0–7.0)
Eosinophils Absolute: 0.3 10*3/uL (ref 0.0–0.5)
LYMPH%: 15.5 % (ref 14.0–49.0)
MCH: 31.1 pg (ref 27.2–33.4)
MCHC: 34.7 g/dL (ref 32.0–36.0)
MCV: 89.4 fL (ref 79.3–98.0)
MONO%: 9.3 % (ref 0.0–14.0)
Platelets: 223 10*3/uL (ref 140–400)
RBC: 4.6 10*6/uL (ref 4.20–5.82)
RDW: 15.2 % — ABNORMAL HIGH (ref 11.0–14.6)

## 2010-10-13 ENCOUNTER — Telehealth: Payer: Self-pay | Admitting: Emergency Medicine

## 2010-10-13 NOTE — Telephone Encounter (Signed)
ERROR

## 2010-10-14 ENCOUNTER — Other Ambulatory Visit: Payer: Self-pay | Admitting: Interventional Radiology

## 2010-10-14 DIAGNOSIS — N2889 Other specified disorders of kidney and ureter: Secondary | ICD-10-CM

## 2010-12-01 ENCOUNTER — Ambulatory Visit (HOSPITAL_COMMUNITY)
Admission: RE | Admit: 2010-12-01 | Discharge: 2010-12-01 | Disposition: A | Discharge status: Home / Self Care | Payer: Medicare Other | Source: Ambulatory Visit | Attending: Oncology | Admitting: Oncology

## 2010-12-01 ENCOUNTER — Other Ambulatory Visit: Payer: Self-pay | Admitting: Oncology

## 2010-12-01 ENCOUNTER — Encounter (HOSPITAL_BASED_OUTPATIENT_CLINIC_OR_DEPARTMENT_OTHER): Payer: Medicare Other | Admitting: Oncology

## 2010-12-01 ENCOUNTER — Encounter (HOSPITAL_COMMUNITY): Payer: Self-pay

## 2010-12-01 DIAGNOSIS — F99 Mental disorder, not otherwise specified: Secondary | ICD-10-CM

## 2010-12-01 DIAGNOSIS — N289 Disorder of kidney and ureter, unspecified: Secondary | ICD-10-CM

## 2010-12-01 DIAGNOSIS — C679 Malignant neoplasm of bladder, unspecified: Secondary | ICD-10-CM

## 2010-12-01 DIAGNOSIS — C649 Malignant neoplasm of unspecified kidney, except renal pelvis: Secondary | ICD-10-CM | POA: Insufficient documentation

## 2010-12-01 DIAGNOSIS — T451X5A Adverse effect of antineoplastic and immunosuppressive drugs, initial encounter: Secondary | ICD-10-CM

## 2010-12-01 DIAGNOSIS — J438 Other emphysema: Secondary | ICD-10-CM | POA: Insufficient documentation

## 2010-12-01 DIAGNOSIS — D6481 Anemia due to antineoplastic chemotherapy: Secondary | ICD-10-CM

## 2010-12-01 DIAGNOSIS — J984 Other disorders of lung: Secondary | ICD-10-CM | POA: Insufficient documentation

## 2010-12-01 LAB — CBC WITH DIFFERENTIAL/PLATELET
Basophils Absolute: 0 10*3/uL (ref 0.0–0.1)
Eosinophils Absolute: 0.2 10*3/uL (ref 0.0–0.5)
LYMPH%: 14.5 % (ref 14.0–49.0)
MCH: 31.5 pg (ref 27.2–33.4)
MCV: 90.5 fL (ref 79.3–98.0)
MONO%: 8.4 % (ref 0.0–14.0)
NEUT#: 5.1 10*3/uL (ref 1.5–6.5)
Platelets: 263 10*3/uL (ref 140–400)
RBC: 4.74 10*6/uL (ref 4.20–5.82)

## 2010-12-01 LAB — CMP (CANCER CENTER ONLY)
BUN, Bld: 22 mg/dL (ref 7–22)
CO2: 26 mEq/L (ref 18–33)
Calcium: 8.7 mg/dL (ref 8.0–10.3)
Chloride: 100 mEq/L (ref 98–108)
Creat: 1.4 mg/dl — ABNORMAL HIGH (ref 0.6–1.2)

## 2010-12-01 MED ORDER — IOHEXOL 300 MG/ML  SOLN
100.0000 mL | Freq: Once | INTRAMUSCULAR | Status: AC | PRN
  Administered 2010-12-01: 100 mL via INTRAVENOUS

## 2010-12-03 ENCOUNTER — Encounter (HOSPITAL_BASED_OUTPATIENT_CLINIC_OR_DEPARTMENT_OTHER): Payer: Medicare Other | Admitting: Oncology

## 2010-12-03 DIAGNOSIS — C679 Malignant neoplasm of bladder, unspecified: Secondary | ICD-10-CM

## 2010-12-03 DIAGNOSIS — Z23 Encounter for immunization: Secondary | ICD-10-CM

## 2010-12-16 ENCOUNTER — Other Ambulatory Visit: Payer: Self-pay | Admitting: Interventional Radiology

## 2010-12-16 ENCOUNTER — Ambulatory Visit
Admission: RE | Admit: 2010-12-16 | Discharge: 2010-12-16 | Disposition: A | Discharge status: Home / Self Care | Payer: Medicare Other | Source: Ambulatory Visit | Attending: Interventional Radiology | Admitting: Interventional Radiology

## 2010-12-16 VITALS — BP 135/75 | HR 68 | Temp 97.6°F | Resp 16 | Ht 68.0 in | Wt 153.0 lb

## 2010-12-16 DIAGNOSIS — N2889 Other specified disorders of kidney and ureter: Secondary | ICD-10-CM

## 2010-12-16 NOTE — Progress Notes (Signed)
Appetite good.  Gained 12 lbs since June 2012.  (Had lost weight during chemotherapy).  Denies hematuria.  Denies pain.  Endurance is good.  Sleeping well.  No complaints.

## 2011-03-18 ENCOUNTER — Other Ambulatory Visit: Payer: Self-pay | Admitting: Interventional Radiology

## 2011-03-18 DIAGNOSIS — N2889 Other specified disorders of kidney and ureter: Secondary | ICD-10-CM

## 2011-03-18 NOTE — Progress Notes (Signed)
Received office notes from [facility]; forwarded to Dr. Clelia Croft.

## 2011-03-20 ENCOUNTER — Telehealth: Payer: Self-pay | Admitting: Oncology

## 2011-03-20 NOTE — Telephone Encounter (Signed)
Talked to pt, gave him appt date for 05/06/11, lab and MD visit

## 2011-04-28 ENCOUNTER — Other Ambulatory Visit: Payer: Medicare Other

## 2011-05-04 ENCOUNTER — Encounter: Payer: Self-pay | Admitting: *Deleted

## 2011-05-06 ENCOUNTER — Ambulatory Visit: Payer: Medicare Other | Admitting: Oncology

## 2011-05-06 ENCOUNTER — Other Ambulatory Visit: Payer: Medicare Other | Admitting: Lab

## 2011-05-11 ENCOUNTER — Other Ambulatory Visit: Payer: Self-pay | Admitting: *Deleted

## 2011-05-11 DIAGNOSIS — C679 Malignant neoplasm of bladder, unspecified: Secondary | ICD-10-CM

## 2011-05-12 ENCOUNTER — Other Ambulatory Visit (HOSPITAL_BASED_OUTPATIENT_CLINIC_OR_DEPARTMENT_OTHER): Payer: Medicare Other | Admitting: Lab

## 2011-05-12 ENCOUNTER — Telehealth: Payer: Self-pay | Admitting: Oncology

## 2011-05-12 ENCOUNTER — Ambulatory Visit (HOSPITAL_BASED_OUTPATIENT_CLINIC_OR_DEPARTMENT_OTHER): Payer: Medicare Other | Admitting: Oncology

## 2011-05-12 VITALS — BP 115/62 | HR 81 | Temp 97.6°F | Ht 68.0 in | Wt 160.0 lb

## 2011-05-12 DIAGNOSIS — C679 Malignant neoplasm of bladder, unspecified: Secondary | ICD-10-CM

## 2011-05-12 DIAGNOSIS — C649 Malignant neoplasm of unspecified kidney, except renal pelvis: Secondary | ICD-10-CM

## 2011-05-12 LAB — CBC WITH DIFFERENTIAL/PLATELET
BASO%: 1.5 % (ref 0.0–2.0)
Basophils Absolute: 0.1 10*3/uL (ref 0.0–0.1)
EOS%: 2.4 % (ref 0.0–7.0)
HCT: 43.1 % (ref 38.4–49.9)
MCH: 31.1 pg (ref 27.2–33.4)
MCHC: 34.3 g/dL (ref 32.0–36.0)
MCV: 90.6 fL (ref 79.3–98.0)
MONO%: 8.5 % (ref 0.0–14.0)
NEUT%: 74.1 % (ref 39.0–75.0)
RDW: 14.6 % (ref 11.0–14.6)
lymph#: 0.9 10*3/uL (ref 0.9–3.3)

## 2011-05-12 LAB — COMPREHENSIVE METABOLIC PANEL
ALT: 12 U/L (ref 0–53)
AST: 15 U/L (ref 0–37)
Alkaline Phosphatase: 59 U/L (ref 39–117)
BUN: 21 mg/dL (ref 6–23)
Calcium: 9 mg/dL (ref 8.4–10.5)
Chloride: 106 mEq/L (ref 96–112)
Creatinine, Ser: 1.43 mg/dL — ABNORMAL HIGH (ref 0.50–1.35)

## 2011-05-12 NOTE — Telephone Encounter (Signed)
[facility] PT APPT SCHEDULE FOR July.

## 2011-05-12 NOTE — Progress Notes (Signed)
Hematology and Oncology Follow Up Visit  Troy Perez [id number] 1945/10/30 66 y.o. 05/12/2011 11:35 AM  CC: Troy Fuller, MD  Troy Perez. Troy Perez, M.D.    Principle Diagnosis: This is a 66 year old gentleman with the following diagnoses:  1. Transitional cell carcinoma of the bladder.  He had a T3 high-grade muscle-invasive tumor diagnosed in August 2011.   2.      Right renal mass, suspicious for renal carcinoma.    Prior Therapy:  1. Status post transurethral resection of bladder tumor, followed by neoadjuvant chemotherapy with 3 cycles of Gemzar and cisplatin.  Therapy concluded in November 2011.  2. The patient underwent a radical cystoprostatectomy on February 23, 2010.  Pathology revealed a 5-cm high-grade urothelial carcinoma, T4 N0 disease.  3. He underwent a percutaneous cryoablation of the right renal mass done in February 2012.  Current therapy: Observation and surveillance.  Interim History:  [name] presents today for a followup visit.  He has continued to do very well without any evidence to suggest recurrent metastatic disease.  He has continued to report no abdominal pain, nausea, no vomiting.  He has continued to have an excellent appetite and weight gain.  Performance status and activity level remains excellent.   Medications: I have reviewed the patient's current medications. Current outpatient prescriptions:Fluticasone-Salmeterol (ADVAIR DISKUS IN), Inhale into the lungs 2 (two) times daily., Disp: , Rfl:   Allergies: No Known Allergies  Past Medical History, Surgical history, Social history, and Family History were reviewed and updated.  Review of Systems: Constitutional:  Negative for fever, chills, night sweats, anorexia, weight loss, pain. Cardiovascular: no chest pain or dyspnea on exertion Respiratory: no cough, shortness of breath, or wheezing Neurological: negative Dermatological: negative ENT: negative Skin: Negative. Gastrointestinal: no  abdominal pain, change in bowel habits, or black or bloody stools Genito-Urinary: no dysuria, trouble voiding, or hematuria Hematological and Lymphatic: negative Breast: negative Musculoskeletal: negative Remaining ROS negative. Physical Exam: Blood pressure 115/62, pulse 81, temperature 97.6 F (36.4 C), temperature source Oral, height 5\' 8"  (1.727 m), weight 160 lb (72.576 kg). ECOG: 0 General appearance: alert Head: Normocephalic, without obvious abnormality, atraumatic Neck: no adenopathy, no carotid bruit, no JVD, supple, symmetrical, trachea midline and thyroid not enlarged, symmetric, no tenderness/mass/nodules Lymph nodes: Cervical, supraclavicular, and axillary nodes normal. Heart:regular rate and rhythm, S1, S2 normal, no murmur, click, rub or gallop Lung:chest clear, no wheezing, rales, normal symmetric air entry Abdomin: soft, non-tender, without masses or organomegaly EXT:no erythema, induration, or nodules   Lab Results: Lab Results  Component Value Date   WBC 6.8 05/12/2011   HGB 14.8 05/12/2011   HCT 43.1 05/12/2011   MCV 90.6 05/12/2011   PLT 253 05/12/2011     Chemistry      Component Value Date/Time   NA 139 12/01/2010 0836   NA 137 09/02/2010 0920   K 4.8* 12/01/2010 0836   K 4.7 09/02/2010 0920   CL 100 12/01/2010 0836   CL 103 09/02/2010 0920   CO2 26 12/01/2010 0836   CO2 26 09/02/2010 0920   BUN 22 12/01/2010 0836   BUN 22 09/02/2010 0920   CREATININE 1.4* 12/01/2010 0836   CREATININE 1.60* 09/02/2010 0920      Component Value Date/Time   CALCIUM 8.7 12/01/2010 0836   CALCIUM 9.0 09/02/2010 0920   ALKPHOS 63 12/01/2010 0836   ALKPHOS 68 09/02/2010 0920   AST 18 12/01/2010 0836   AST 13 09/02/2010 0920   ALT 8 09/02/2010 0920  BILITOT 0.60 12/01/2010 0836   BILITOT 0.5 09/02/2010 0920        Impression and Plan: This is a pleasant 66 year old gentleman with the follow issues:  1. T4 N0 transitional cell carcinoma of the bladder.  He has high-risk features.   At this time, he is at risk of developing metastatic disease.  At this time, I do not really see any evidence of clear-cut metastasis.  He does have a 4-mm nodule in the lung, which could be old inflammatory.  We will continue to monitor it.  2. 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  He is status post cryoablation done with Dr. Fredia Sorrow regarding that for possible local recurrence.  3. His next CT scan scheduled for 05/2011. Follow up will be in 5 months.    [facility], MD 2/27/201311:35 AM

## 2011-05-25 ENCOUNTER — Ambulatory Visit (HOSPITAL_COMMUNITY)
Admission: RE | Admit: 2011-05-25 | Discharge: 2011-05-25 | Disposition: A | Discharge status: Home / Self Care | Payer: Medicare Other | Source: Ambulatory Visit | Attending: Interventional Radiology | Admitting: Interventional Radiology

## 2011-05-25 DIAGNOSIS — N289 Disorder of kidney and ureter, unspecified: Secondary | ICD-10-CM | POA: Insufficient documentation

## 2011-05-25 DIAGNOSIS — C679 Malignant neoplasm of bladder, unspecified: Secondary | ICD-10-CM | POA: Insufficient documentation

## 2011-05-25 DIAGNOSIS — J984 Other disorders of lung: Secondary | ICD-10-CM | POA: Insufficient documentation

## 2011-05-25 DIAGNOSIS — N269 Renal sclerosis, unspecified: Secondary | ICD-10-CM | POA: Insufficient documentation

## 2011-05-25 DIAGNOSIS — N2889 Other specified disorders of kidney and ureter: Secondary | ICD-10-CM

## 2011-05-25 DIAGNOSIS — C649 Malignant neoplasm of unspecified kidney, except renal pelvis: Secondary | ICD-10-CM | POA: Insufficient documentation

## 2011-05-25 MED ORDER — IOHEXOL 300 MG/ML  SOLN
100.0000 mL | Freq: Once | INTRAMUSCULAR | Status: AC | PRN
  Administered 2011-05-25: 100 mL via INTRAVENOUS

## 2011-05-26 ENCOUNTER — Other Ambulatory Visit: Payer: Medicare Other

## 2011-05-26 ENCOUNTER — Other Ambulatory Visit (HOSPITAL_COMMUNITY): Payer: Medicare Other

## 2011-06-08 ENCOUNTER — Ambulatory Visit
Admission: RE | Admit: 2011-06-08 | Discharge: 2011-06-08 | Disposition: A | Discharge status: Home / Self Care | Payer: Medicare Other | Source: Ambulatory Visit | Attending: Interventional Radiology | Admitting: Interventional Radiology

## 2011-06-08 DIAGNOSIS — N2889 Other specified disorders of kidney and ureter: Secondary | ICD-10-CM

## 2011-06-08 NOTE — Progress Notes (Signed)
Denies hematuria or problems w/ urination.  Denies pain associated w/ procedure.  Pt states that he is doing well overall.

## 2011-10-05 ENCOUNTER — Telehealth: Payer: Self-pay | Admitting: *Deleted

## 2011-10-05 ENCOUNTER — Other Ambulatory Visit (HOSPITAL_BASED_OUTPATIENT_CLINIC_OR_DEPARTMENT_OTHER): Payer: Medicare Other

## 2011-10-05 ENCOUNTER — Ambulatory Visit (HOSPITAL_BASED_OUTPATIENT_CLINIC_OR_DEPARTMENT_OTHER): Payer: Medicare Other | Admitting: Oncology

## 2011-10-05 VITALS — BP 129/77 | HR 72 | Temp 98.2°F | Wt 155.3 lb

## 2011-10-05 DIAGNOSIS — C679 Malignant neoplasm of bladder, unspecified: Secondary | ICD-10-CM

## 2011-10-05 LAB — CBC WITH DIFFERENTIAL/PLATELET
BASO%: 1.1 % (ref 0.0–2.0)
EOS%: 3 % (ref 0.0–7.0)
HCT: 43.8 % (ref 38.4–49.9)
LYMPH%: 12.9 % — ABNORMAL LOW (ref 14.0–49.0)
MCH: 30.7 pg (ref 27.2–33.4)
MCHC: 34.3 g/dL (ref 32.0–36.0)
MCV: 89.5 fL (ref 79.3–98.0)
MONO%: 10.8 % (ref 0.0–14.0)
NEUT%: 72.2 % (ref 39.0–75.0)
lymph#: 0.9 10*3/uL (ref 0.9–3.3)

## 2011-10-05 LAB — COMPREHENSIVE METABOLIC PANEL
ALT: 11 U/L (ref 0–53)
AST: 13 U/L (ref 0–37)
Alkaline Phosphatase: 59 U/L (ref 39–117)
Chloride: 105 mEq/L (ref 96–112)
Creatinine, Ser: 1.45 mg/dL — ABNORMAL HIGH (ref 0.50–1.35)
Total Bilirubin: 0.5 mg/dL (ref 0.3–1.2)

## 2011-10-05 NOTE — Progress Notes (Signed)
Hematology and Oncology Follow Up Visit  Troy Perez [id number] [birth year] 66 y.o. 10/05/2011 10:09 AM  CC: Valetta Fuller, MD  Gloriajean Dell. Andrey Campanile, M.D.    Principle Diagnosis: This is a 66 year old gentleman with the following diagnoses:  1. Transitional cell carcinoma of the bladder.  He had a T3 high-grade muscle-invasive tumor diagnosed in August 2011.   2.      Right renal mass, suspicious for renal carcinoma.  Prior Therapy:  1. Status post transurethral resection of bladder tumor, followed by neoadjuvant chemotherapy with 3 cycles of Gemzar and cisplatin.  Therapy concluded in November 2011.  2. The patient underwent a radical cystoprostatectomy on February 23, 2010.  Pathology revealed a 5-cm high-grade urothelial carcinoma, T4 N0 disease.  3. He underwent a percutaneous cryoablation of the right renal mass done in February 2012.  Current therapy: Observation and surveillance.  Interim History:  Troy Perez presents today for a followup visit.  He has continued to do very well without any evidence to suggest recurrent metastatic disease.  He has continued to report no abdominal pain, nausea, no vomiting.  He has continued to have an excellent appetite and weight gain.  Performance status and activity level remains excellent. He reports no urine problems and his vision is excellent after his eye surgery.    Medications: I have reviewed the patient's current medications. Current outpatient prescriptions:Fluticasone-Salmeterol (ADVAIR DISKUS IN), Inhale into the lungs 2 (two) times daily., Disp: , Rfl:   Allergies: No Known Allergies  Past Medical History, Surgical history, Social history, and Family History were reviewed and updated.  Review of Systems: Constitutional:  Negative for fever, chills, night sweats, anorexia, weight loss, pain. Cardiovascular: no chest pain or dyspnea on exertion Respiratory: no cough, shortness of breath, or wheezing Neurological:  negative Dermatological: negative ENT: negative Skin: Negative. Gastrointestinal: no abdominal pain, change in bowel habits, or black or bloody stools Genito-Urinary: no dysuria, trouble voiding, or hematuria Hematological and Lymphatic: negative Breast: negative Musculoskeletal: negative Remaining ROS negative. Physical Exam: Blood pressure 129/77, pulse 72, temperature 98.2 F (36.8 C), temperature source Oral, weight 155 lb 5 oz (70.449 kg). ECOG: 0 General appearance: alert Head: Normocephalic, without obvious abnormality, atraumatic Neck: no adenopathy, no carotid bruit, no JVD, supple, symmetrical, trachea midline and thyroid not enlarged, symmetric, no tenderness/mass/nodules Lymph nodes: Cervical, supraclavicular, and axillary nodes normal. Heart:regular rate and rhythm, S1, S2 normal, no murmur, click, rub or gallop Lung:chest clear, no wheezing, rales, normal symmetric air entry Abdomin: soft, non-tender, without masses or organomegaly EXT:no erythema, induration, or nodules   Lab Results: Lab Results  Component Value Date   WBC 6.7 10/05/2011   HGB 15.0 10/05/2011   HCT 43.8 10/05/2011   MCV 89.5 10/05/2011   PLT 235 10/05/2011    Impression and Plan: This is a pleasant 66 year old gentleman with the follow issues:  1. T4 N0 transitional cell carcinoma of the bladder.  He has high-risk features.  At this time, he is at risk of developing metastatic disease.  At this time, I do not really see any evidence of clear-cut metastasis.  He does have a 4-mm nodule in the lung, which could be old inflammatory.  We will continue to monitor it. [facility]  2. 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  He is status post cryoablation done with Dr. Fredia Sorrow regarding that for possible local recurrence.  3. Follow up will be in  4 months. Sooner if his scan show abnormalities.    [facility], MD 7/23/201310:09 AM

## 2011-10-05 NOTE — Telephone Encounter (Signed)
gave patient appointment for 02-15-2012 starting at 8:30am printed out calendar and gave to the patient

## 2012-02-15 ENCOUNTER — Ambulatory Visit (HOSPITAL_BASED_OUTPATIENT_CLINIC_OR_DEPARTMENT_OTHER): Payer: Medicare Other | Admitting: Oncology

## 2012-02-15 ENCOUNTER — Other Ambulatory Visit (HOSPITAL_BASED_OUTPATIENT_CLINIC_OR_DEPARTMENT_OTHER): Payer: Medicare Other | Admitting: Lab

## 2012-02-15 ENCOUNTER — Telehealth: Payer: Self-pay | Admitting: Oncology

## 2012-02-15 VITALS — BP 116/68 | HR 71 | Temp 98.2°F | Resp 20 | Ht 67.5 in | Wt 158.2 lb

## 2012-02-15 DIAGNOSIS — C679 Malignant neoplasm of bladder, unspecified: Secondary | ICD-10-CM

## 2012-02-15 DIAGNOSIS — C649 Malignant neoplasm of unspecified kidney, except renal pelvis: Secondary | ICD-10-CM

## 2012-02-15 LAB — CBC WITH DIFFERENTIAL/PLATELET
BASO%: 1 % (ref 0.0–2.0)
Basophils Absolute: 0.1 10*3/uL (ref 0.0–0.1)
EOS%: 2.3 % (ref 0.0–7.0)
HCT: 43.3 % (ref 38.4–49.9)
HGB: 15.1 g/dL (ref 13.0–17.1)
LYMPH%: 10.3 % — ABNORMAL LOW (ref 14.0–49.0)
MCH: 31.5 pg (ref 27.2–33.4)
MCHC: 34.8 g/dL (ref 32.0–36.0)
MCV: 90.6 fL (ref 79.3–98.0)
NEUT%: 75.2 % — ABNORMAL HIGH (ref 39.0–75.0)
Platelets: 245 10*3/uL (ref 140–400)

## 2012-02-15 LAB — COMPREHENSIVE METABOLIC PANEL (CC13)
ALT: 16 U/L (ref 0–55)
AST: 14 U/L (ref 5–34)
BUN: 25 mg/dL (ref 7.0–26.0)
Creatinine: 1.5 mg/dL — ABNORMAL HIGH (ref 0.7–1.3)
Total Bilirubin: 0.49 mg/dL (ref 0.20–1.20)

## 2012-02-15 LAB — LACTATE DEHYDROGENASE (CC13): LDH: 146 U/L (ref 125–245)

## 2012-02-15 NOTE — Progress Notes (Signed)
Hematology and Oncology Follow Up Visit  Troy Perez [id number] 07-10-1945 66 y.o. 02/15/2012 9:27 AM  CC: Valetta Fuller, MD  Gloriajean Dell. Andrey Campanile, M.D.    Principle Diagnosis: This is a 66 year old gentleman with the following diagnoses:  1. Transitional cell carcinoma of the bladder.  He had a T3 high-grade muscle-invasive tumor diagnosed in August 2011.   2.      Right renal mass, suspicious for renal carcinoma.  Prior Therapy:  1. Status post transurethral resection of bladder tumor, followed by neoadjuvant chemotherapy with 3 cycles of Gemzar and cisplatin.  Therapy concluded in November 2011.  2. The patient underwent a radical cystoprostatectomy on February 23, 2010.  Pathology revealed a 5-cm high-grade urothelial carcinoma, T4 N0 disease.  3. He underwent a percutaneous cryoablation of the right renal mass done in February 2012.  Current therapy: Observation and surveillance.  Interim History:  Troy Perez presents today for a followup visit.  He has continued to do very well without any evidence to suggest recurrent metastatic disease.  He has continued to report no abdominal pain, nausea, no vomiting.  He has continued to have an excellent appetite and weight gain.  Performance status and activity level remains excellent. No new complaints at this time.    Medications: I have reviewed the patient's current medications. Current outpatient prescriptions:Fluticasone-Salmeterol (ADVAIR DISKUS IN), Inhale into the lungs 2 (two) times daily., Disp: , Rfl:   Allergies: No Known Allergies  Past Medical History, Surgical history, Social history, and Family History were reviewed and updated.  Review of Systems: Constitutional:  Negative for fever, chills, night sweats, anorexia, weight loss, pain. Cardiovascular: no chest pain or dyspnea on exertion Respiratory: no cough, shortness of breath, or wheezing Neurological: negative Dermatological: negative ENT: negative Skin:  Negative. Gastrointestinal: no abdominal pain, change in bowel habits, or black or bloody stools Genito-Urinary: no dysuria, trouble voiding, or hematuria Hematological and Lymphatic: negative Breast: negative Musculoskeletal: negative Remaining ROS negative. Physical Exam: There were no vitals taken for this visit. ECOG: 0 General appearance: alert Head: Normocephalic, without obvious abnormality, atraumatic Neck: no adenopathy, no carotid bruit, no JVD, supple, symmetrical, trachea midline and thyroid not enlarged, symmetric, no tenderness/mass/nodules Lymph nodes: Cervical, supraclavicular, and axillary nodes normal. Heart:regular rate and rhythm, S1, S2 normal, no murmur, click, rub or gallop Lung:chest clear, no wheezing, rales, normal symmetric air entry Abdomin: soft, non-tender, without masses or organomegaly EXT:no erythema, induration, or nodules   Lab Results: Lab Results  Component Value Date   WBC 8.1 02/15/2012   HGB 15.1 02/15/2012   HCT 43.3 02/15/2012   MCV 90.6 02/15/2012   PLT 245 02/15/2012    Impression and Plan: This is a pleasant 66 year old gentleman with the follow issues:  1. T4 N0 transitional cell carcinoma of the bladder.  He has high-risk features.  At this time, he is at risk of developing metastatic disease.  At this time, I do not really see any evidence of clear-cut metastasis. He does have a 4-mm nodule in the lung, which could be old inflammatory.  We will continue to monitor it. He is due for a repeat CT scan in the next few months.  2. 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  He is status post cryoablation done with Dr. Fredia Sorrow regarding that for possible local recurrence.  3. Follow up will be in 6 months. Sooner if his scan show abnormalities.    [facility], MD 12/3/20139:27 AM

## 2012-02-15 NOTE — Telephone Encounter (Signed)
appts made and printed for the pt

## 2012-04-26 ENCOUNTER — Other Ambulatory Visit (HOSPITAL_COMMUNITY): Payer: Self-pay | Admitting: Interventional Radiology

## 2012-04-26 DIAGNOSIS — N2889 Other specified disorders of kidney and ureter: Secondary | ICD-10-CM

## 2012-04-29 ENCOUNTER — Other Ambulatory Visit: Payer: Self-pay

## 2012-05-17 ENCOUNTER — Ambulatory Visit
Admission: RE | Admit: 2012-05-17 | Discharge: 2012-05-17 | Disposition: A | Discharge status: Home / Self Care | Payer: Medicare Other | Source: Ambulatory Visit | Attending: Interventional Radiology | Admitting: Interventional Radiology

## 2012-05-17 DIAGNOSIS — N2889 Other specified disorders of kidney and ureter: Secondary | ICD-10-CM

## 2012-05-17 NOTE — Progress Notes (Signed)
Denies hematuria or any other problems with urination.    Denies pain associated with cryoablation.  Continues to have 6 mo f/u's with Drs Isabel Caprice and Clelia Croft.  Henri Carmell [place], Charity fundraiser

## 2012-05-17 NOTE — Progress Notes (Signed)
Patient ID: Troy Perez, male   DOB: Jul 22, 1945, 67 y.o.   MRN: [id number]  ESTABLISHED PATIENT OFFICE VISIT  Chief Complaint: Post percutaneous cryoablation of a right renal tumor on 04/24/2010.  History: Mr. Troy Perez has been doing well and denies any current symptoms. He has continued to follow up with Dr. Clelia Croft and Dr. Isabel Caprice for surveillance after resection of a transitional cell carcinoma of the bladder.  Review of Systems: No fever, chills, hematuria, dysuria or flank pain. 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  Exam: Vital signs: Blood pressure 131/67, pulse 85, respirations 14, temperature 97.9, oxygen saturation 96% on room air. General: No acute distress. The abdomen: Soft and nontender. No flank tenderness.  Labs: BUN 25, creatinine 1.5 on 02/15/2012  Imaging: Follow-up CT was performed on 03/28/2012 at [facility]. 3 mm left upper lobe pulmonary nodule was again noted. Stable area of rim enhancement is present along the superior margin of the prior right renal cryoablation, measuring approximately 9 mm in diameter. No evidence of recurrent transitional carcinoma in the abdomen or pelvis.  Assessment and Plan: Mr. Troy Perez is doing well. The area of residual enhancement along the superior margin of prior cryoablation is completely unchanged over nearly 2 years. I have recommended continued surveillance and told the patient that I would not currently recommend any type of retreatment in this area or biopsy. The patient will continue to have follow-up restaging CT studies for is transitional cell carcinoma. I told the patient that I will keep monitoring studies and plan to see him in approximately 1 year to reevaluate the area of stable marginal enhancement at the level of previous right renal cryoablation.

## 2012-08-22 ENCOUNTER — Other Ambulatory Visit (HOSPITAL_BASED_OUTPATIENT_CLINIC_OR_DEPARTMENT_OTHER): Payer: Medicare Other | Admitting: Lab

## 2012-08-22 ENCOUNTER — Ambulatory Visit (HOSPITAL_BASED_OUTPATIENT_CLINIC_OR_DEPARTMENT_OTHER): Payer: Medicare Other | Admitting: Oncology

## 2012-08-22 ENCOUNTER — Telehealth: Payer: Self-pay | Admitting: Oncology

## 2012-08-22 VITALS — BP 121/73 | HR 74 | Temp 97.5°F | Resp 18 | Ht 67.0 in | Wt 158.9 lb

## 2012-08-22 DIAGNOSIS — C649 Malignant neoplasm of unspecified kidney, except renal pelvis: Secondary | ICD-10-CM

## 2012-08-22 DIAGNOSIS — C679 Malignant neoplasm of bladder, unspecified: Secondary | ICD-10-CM

## 2012-08-22 LAB — CBC WITH DIFFERENTIAL/PLATELET
Basophils Absolute: 0 10*3/uL (ref 0.0–0.1)
EOS%: 2.3 % (ref 0.0–7.0)
Eosinophils Absolute: 0.2 10*3/uL (ref 0.0–0.5)
HCT: 41.7 % (ref 38.4–49.9)
HGB: 15.2 g/dL (ref 13.0–17.1)
MCH: 32 pg (ref 27.2–33.4)
MCV: 87.7 fL (ref 79.3–98.0)
NEUT#: 5.1 10*3/uL (ref 1.5–6.5)
NEUT%: 76.4 % — ABNORMAL HIGH (ref 39.0–75.0)
RDW: 13.8 % (ref 11.0–14.6)
lymph#: 0.7 10*3/uL — ABNORMAL LOW (ref 0.9–3.3)

## 2012-08-22 LAB — COMPREHENSIVE METABOLIC PANEL (CC13)
AST: 12 U/L (ref 5–34)
Albumin: 3.9 g/dL (ref 3.5–5.0)
BUN: 21.3 mg/dL (ref 7.0–26.0)
CO2: 27 mEq/L (ref 22–29)
Calcium: 9.2 mg/dL (ref 8.4–10.4)
Chloride: 105 mEq/L (ref 98–107)
Creatinine: 1.5 mg/dL — ABNORMAL HIGH (ref 0.7–1.3)
Glucose: 84 mg/dl (ref 70–99)
Potassium: 4.9 mEq/L (ref 3.5–5.1)

## 2012-08-22 NOTE — Progress Notes (Signed)
Hematology and Oncology Follow Up Visit  Troy Perez [id number] August 24, 1945 68 y.o. 08/22/2012 10:11 AM  CC: Troy Fuller, MD  Troy Perez. Andrey Campanile, M.D.    Principle Diagnosis: This is a 67 year old gentleman with the following diagnoses:  1. Transitional cell carcinoma of the bladder.  He had a T3 high-grade muscle-invasive tumor diagnosed in August 2011.   2.      Right renal mass, suspicious for renal carcinoma.  Prior Therapy:  1. Status post transurethral resection of bladder tumor, followed by neoadjuvant chemotherapy with 3 cycles of Gemzar and cisplatin.  Therapy concluded in November 2011.  2. The patient underwent a radical cystoprostatectomy on February 23, 2010.  Pathology revealed a 5-cm high-grade urothelial carcinoma, T4 N0 disease.  3. He underwent a percutaneous cryoablation of the right renal mass done in February 2012.  Current therapy: Observation and surveillance.  Interim History:  Troy Perez presents today for a followup visit.  He has continued to do very well without any evidence to suggest recurrent metastatic disease.  He has continued to report no abdominal pain, nausea, no vomiting.  He has continued to have an excellent appetite and weight gain.  Performance status and activity level remains excellent. He is very active and no decline since his last visit.   Medications: I have reviewed the patient's current medications. Current outpatient prescriptions:Fluticasone-Salmeterol (ADVAIR DISKUS IN), Inhale into the lungs 2 (two) times daily., Disp: , Rfl:   Allergies: No Known Allergies  Past Medical History, Surgical history, Social history, and Family History were reviewed and updated.  Review of Systems: Constitutional:  Negative for fever, chills, night sweats, anorexia, weight loss, pain. Cardiovascular: no chest pain or dyspnea on exertion Respiratory: no cough, shortness of breath, or wheezing Neurological: negative Dermatological: negative ENT:  negative Skin: Negative. Gastrointestinal: no abdominal pain, change in bowel habits, or black or bloody stools Genito-Urinary: no dysuria, trouble voiding, or hematuria Hematological and Lymphatic: negative Breast: negative Musculoskeletal: negative Remaining ROS negative. Physical Exam: Blood pressure 121/73, pulse 74, temperature 97.5 F (36.4 C), temperature source Oral, resp. rate 18, height 5\' 7"  (1.702 m), weight 158 lb 14.4 oz (72.077 kg). ECOG: 0 General appearance: alert Head: Normocephalic, without obvious abnormality, atraumatic Neck: no adenopathy, no carotid bruit, no JVD, supple, symmetrical, trachea midline and thyroid not enlarged, symmetric, no tenderness/mass/nodules Lymph nodes: Cervical, supraclavicular, and axillary nodes normal. Heart:regular rate and rhythm, S1, S2 normal, no murmur, click, rub or gallop Lung:chest clear, no wheezing, rales, normal symmetric air entry Abdomin: soft, non-tender, without masses or organomegaly EXT:no erythema, induration, or nodules   Lab Results: Lab Results  Component Value Date   WBC 6.7 08/22/2012   HGB 15.2 08/22/2012   HCT 41.7 08/22/2012   MCV 87.7 08/22/2012   PLT 220 08/22/2012    Impression and Plan: This is a pleasant 67 year old gentleman with the follow issues:  1. T4 N0 transitional cell carcinoma of the bladder.  He has high-risk features.  At this time, he is at risk of developing metastatic disease.  At this time, I do not really see any evidence of clear-cut metastasis. He does have a 4-mm nodule in the lung, which could be old inflammatory.  We will continue to monitor it. He is due for a repeat CT scan this summer. Last CT scan in 09/2101 was clear.  2. 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  He is status post cryoablation done with Dr. Fredia Sorrow regarding that for possible local recurrence.  3. Follow up will be in 6 months. Sooner if his scan show abnormalities.    [facility], MD 6/10/201410:11 AM

## 2012-08-22 NOTE — Telephone Encounter (Signed)
gv and printed appt sched and avs for pt  °

## 2012-10-18 ENCOUNTER — Other Ambulatory Visit: Payer: Self-pay

## 2012-11-17 ENCOUNTER — Encounter: Payer: Self-pay | Admitting: Urology

## 2013-01-18 ENCOUNTER — Other Ambulatory Visit: Payer: Self-pay

## 2013-02-21 ENCOUNTER — Telehealth: Payer: Self-pay | Admitting: Oncology

## 2013-02-21 ENCOUNTER — Ambulatory Visit (HOSPITAL_BASED_OUTPATIENT_CLINIC_OR_DEPARTMENT_OTHER): Payer: Medicare Other | Admitting: Oncology

## 2013-02-21 ENCOUNTER — Other Ambulatory Visit (HOSPITAL_BASED_OUTPATIENT_CLINIC_OR_DEPARTMENT_OTHER): Payer: Medicare Other

## 2013-02-21 VITALS — BP 128/74 | HR 80 | Temp 98.5°F | Resp 18 | Ht 67.0 in | Wt 162.3 lb

## 2013-02-21 DIAGNOSIS — R911 Solitary pulmonary nodule: Secondary | ICD-10-CM

## 2013-02-21 DIAGNOSIS — C649 Malignant neoplasm of unspecified kidney, except renal pelvis: Secondary | ICD-10-CM

## 2013-02-21 DIAGNOSIS — C679 Malignant neoplasm of bladder, unspecified: Secondary | ICD-10-CM

## 2013-02-21 LAB — CBC WITH DIFFERENTIAL/PLATELET
BASO%: 0.7 % (ref 0.0–2.0)
HCT: 45.4 % (ref 38.4–49.9)
HGB: 15.9 g/dL (ref 13.0–17.1)
LYMPH%: 8 % — ABNORMAL LOW (ref 14.0–49.0)
MCH: 31.4 pg (ref 27.2–33.4)
MONO#: 0.8 10*3/uL (ref 0.1–0.9)
MONO%: 10.2 % (ref 0.0–14.0)
NEUT#: 6.2 10*3/uL (ref 1.5–6.5)
RDW: 13.7 % (ref 11.0–14.6)
WBC: 7.8 10*3/uL (ref 4.0–10.3)
lymph#: 0.6 10*3/uL — ABNORMAL LOW (ref 0.9–3.3)

## 2013-02-21 LAB — COMPREHENSIVE METABOLIC PANEL (CC13)
ALT: 16 U/L (ref 0–55)
Alkaline Phosphatase: 66 U/L (ref 40–150)
Glucose: 100 mg/dl (ref 70–140)
Sodium: 139 mEq/L (ref 136–145)
Total Bilirubin: 0.64 mg/dL (ref 0.20–1.20)
Total Protein: 7.5 g/dL (ref 6.4–8.3)

## 2013-02-21 NOTE — Telephone Encounter (Signed)
gv adn printed papt sched and avs for pt for July 2015

## 2013-02-21 NOTE — Progress Notes (Signed)
Hematology and Oncology Follow Up Visit  Troy Perez [id number] 07/27/1945 67 y.o. 02/21/2013 8:57 AM  CC: Valetta Fuller, MD  Gloriajean Dell. Andrey Campanile, M.D.    Principle Diagnosis: This is a 67 year old gentleman with the following diagnoses:  1. Transitional cell carcinoma of the bladder.  He had a T3 high-grade muscle-invasive tumor diagnosed in August 2011.   2.      Right renal mass, suspicious for renal carcinoma.  Prior Therapy:  1. Status post transurethral resection of bladder tumor, followed by neoadjuvant chemotherapy with 3 cycles of Gemzar and cisplatin.  Therapy concluded in November 2011.  2. The patient underwent a radical cystoprostatectomy on February 23, 2010.  Pathology revealed a 5-cm high-grade urothelial carcinoma, T4 N0 disease.  3. He underwent a percutaneous cryoablation of the right renal mass done in February 2012.  Current therapy: Observation and surveillance.  Interim History:  Mr. Dredge presents today for a followup visit with his wife. Since his last visit, he has continued to do very well without any evidence to suggest recurrent metastatic disease.  He has continued to report no abdominal pain, nausea, no vomiting.  He has continued to have an excellent appetite and weight gain. Performance status and activity level remains excellent. He is very active and no decline since his last visit. He was able to play golf most of the season without any decline. He has not reported any hematuria or dysuria or any genitourinary complaints. Has not reported any constitutional symptoms.  Medications: I have reviewed the patient's current medications. Current outpatient prescriptions:Fluticasone-Salmeterol (ADVAIR DISKUS IN), Inhale into the lungs 2 (two) times daily., Disp: , Rfl:   Allergies: No Known Allergies  Past Medical History, Surgical history, Social history, and Family History were reviewed and updated.  Review of Systems:  Remaining ROS negative. Physical  Exam: Blood pressure 128/74, pulse 80, temperature 98.5 F (36.9 C), temperature source Oral, resp. rate 18, height 5\' 7"  (1.702 m), weight 162 lb 4.8 oz (73.619 kg). ECOG: 0 General appearance: alert Head: Normocephalic, without obvious abnormality, atraumatic Neck: no adenopathy, no carotid bruit, no JVD, supple, symmetrical, trachea midline and thyroid not enlarged, symmetric, no tenderness/mass/nodules Lymph nodes: Cervical, supraclavicular, and axillary nodes normal. Heart:regular rate and rhythm, S1, S2 normal, no murmur, click, rub or gallop Lung:chest clear, no wheezing, rales, normal symmetric air entry Abdomin: soft, non-tender, without masses or organomegaly EXT:no erythema, induration, or nodules   Lab Results: Lab Results  Component Value Date   WBC 7.8 02/21/2013   HGB 15.9 02/21/2013   HCT 45.4 02/21/2013   MCV 89.7 02/21/2013   PLT 230 02/21/2013    Impression and Plan: This is a pleasant 67 year old gentleman with the follow issues:  1. T4 N0 transitional cell carcinoma of the bladder.  He has high-risk features.  At this time, he is at risk of developing metastatic disease.  At this time, I do not really see any evidence of clear-cut metastasis. He does have a 4-mm nodule in the lung, which could be old inflammatory.  We will continue to monitor it. He is due for a repeat CT scan this summer which will be done at Dr. Ellin Goodie  [facility]. 2. 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  He is status post cryoablation done with Dr. Fredia Sorrow regarding that for possible local recurrence.  3. Follow up will be in 6 months. Sooner if his scan show abnormalities.    SHADAD,FIRAS, MD 12/10/20148:57 AM

## 2013-04-18 ENCOUNTER — Other Ambulatory Visit (HOSPITAL_COMMUNITY): Payer: Self-pay | Admitting: Interventional Radiology

## 2013-04-18 DIAGNOSIS — N2889 Other specified disorders of kidney and ureter: Secondary | ICD-10-CM

## 2013-06-05 ENCOUNTER — Inpatient Hospital Stay (HOSPITAL_COMMUNITY): Payer: Medicare HMO

## 2013-06-05 ENCOUNTER — Encounter (HOSPITAL_COMMUNITY): Payer: Self-pay | Admitting: Emergency Medicine

## 2013-06-05 ENCOUNTER — Emergency Department (HOSPITAL_COMMUNITY): Payer: Medicare HMO

## 2013-06-05 ENCOUNTER — Inpatient Hospital Stay (HOSPITAL_COMMUNITY)
Admission: EM | Admit: 2013-06-05 | Discharge: 2013-06-13 | DRG: 871 | Disposition: E | Payer: Medicare HMO | Attending: Pulmonary Disease | Admitting: Pulmonary Disease

## 2013-06-05 DIAGNOSIS — D696 Thrombocytopenia, unspecified: Secondary | ICD-10-CM | POA: Diagnosis not present

## 2013-06-05 DIAGNOSIS — N179 Acute kidney failure, unspecified: Secondary | ICD-10-CM | POA: Diagnosis present

## 2013-06-05 DIAGNOSIS — J9600 Acute respiratory failure, unspecified whether with hypoxia or hypercapnia: Secondary | ICD-10-CM | POA: Diagnosis present

## 2013-06-05 DIAGNOSIS — E872 Acidosis, unspecified: Secondary | ICD-10-CM

## 2013-06-05 DIAGNOSIS — R6520 Severe sepsis without septic shock: Secondary | ICD-10-CM

## 2013-06-05 DIAGNOSIS — J189 Pneumonia, unspecified organism: Secondary | ICD-10-CM

## 2013-06-05 DIAGNOSIS — J441 Chronic obstructive pulmonary disease with (acute) exacerbation: Secondary | ICD-10-CM | POA: Diagnosis present

## 2013-06-05 DIAGNOSIS — G9341 Metabolic encephalopathy: Secondary | ICD-10-CM | POA: Diagnosis present

## 2013-06-05 DIAGNOSIS — Z515 Encounter for palliative care: Secondary | ICD-10-CM

## 2013-06-05 DIAGNOSIS — Z66 Do not resuscitate: Secondary | ICD-10-CM | POA: Diagnosis not present

## 2013-06-05 DIAGNOSIS — I959 Hypotension, unspecified: Secondary | ICD-10-CM | POA: Diagnosis present

## 2013-06-05 DIAGNOSIS — IMO0002 Reserved for concepts with insufficient information to code with codable children: Secondary | ICD-10-CM

## 2013-06-05 DIAGNOSIS — E875 Hyperkalemia: Secondary | ICD-10-CM | POA: Diagnosis not present

## 2013-06-05 DIAGNOSIS — E871 Hypo-osmolality and hyponatremia: Secondary | ICD-10-CM | POA: Diagnosis present

## 2013-06-05 DIAGNOSIS — J9690 Respiratory failure, unspecified, unspecified whether with hypoxia or hypercapnia: Secondary | ICD-10-CM

## 2013-06-05 DIAGNOSIS — A419 Sepsis, unspecified organism: Principal | ICD-10-CM | POA: Diagnosis present

## 2013-06-05 DIAGNOSIS — Z8551 Personal history of malignant neoplasm of bladder: Secondary | ICD-10-CM

## 2013-06-05 DIAGNOSIS — F17200 Nicotine dependence, unspecified, uncomplicated: Secondary | ICD-10-CM | POA: Diagnosis present

## 2013-06-05 DIAGNOSIS — J15212 Pneumonia due to Methicillin resistant Staphylococcus aureus: Secondary | ICD-10-CM | POA: Diagnosis present

## 2013-06-05 DIAGNOSIS — E878 Other disorders of electrolyte and fluid balance, not elsewhere classified: Secondary | ICD-10-CM | POA: Diagnosis present

## 2013-06-05 DIAGNOSIS — N19 Unspecified kidney failure: Secondary | ICD-10-CM

## 2013-06-05 DIAGNOSIS — R6521 Severe sepsis with septic shock: Secondary | ICD-10-CM

## 2013-06-05 DIAGNOSIS — E1169 Type 2 diabetes mellitus with other specified complication: Secondary | ICD-10-CM | POA: Diagnosis present

## 2013-06-05 HISTORY — DX: Tobacco use: Z72.0

## 2013-06-05 HISTORY — DX: Chronic obstructive pulmonary disease, unspecified: J44.9

## 2013-06-05 LAB — CBC WITH DIFFERENTIAL/PLATELET
Basophils Absolute: 0 10*3/uL (ref 0.0–0.1)
Basophils Relative: 0 % (ref 0–1)
EOS ABS: 0 10*3/uL (ref 0.0–0.7)
EOS PCT: 0 % (ref 0–5)
HCT: 46 % (ref 39.0–52.0)
Hemoglobin: 16.2 g/dL (ref 13.0–17.0)
LYMPHS PCT: 7 % — AB (ref 12–46)
Lymphs Abs: 0.5 10*3/uL — ABNORMAL LOW (ref 0.7–4.0)
MCH: 31 pg (ref 26.0–34.0)
MCHC: 35.2 g/dL (ref 30.0–36.0)
MCV: 88 fL (ref 78.0–100.0)
MONO ABS: 1.1 10*3/uL — AB (ref 0.1–1.0)
Monocytes Relative: 15 % — ABNORMAL HIGH (ref 3–12)
NEUTROS PCT: 78 % — AB (ref 43–77)
Neutro Abs: 5.5 10*3/uL (ref 1.7–7.7)
PLATELETS: 240 10*3/uL (ref 150–400)
RBC: 5.23 MIL/uL (ref 4.22–5.81)
RDW: 14.3 % (ref 11.5–15.5)
WBC: 7.1 10*3/uL (ref 4.0–10.5)

## 2013-06-05 LAB — BLOOD GAS, ARTERIAL
Acid-base deficit: 8.1 mmol/L — ABNORMAL HIGH (ref 0.0–2.0)
Acid-base deficit: 10.7 mmol/L — ABNORMAL HIGH (ref 0.0–2.0)
BICARBONATE: 15.5 meq/L — AB (ref 20.0–24.0)
Bicarbonate: 15.4 mEq/L — ABNORMAL LOW (ref 20.0–24.0)
Drawn by: 27407
Drawn by: 38235
FIO2: 0.32 %
O2 Content: 5 L/min
O2 Saturation: 95.8 %
O2 Saturation: 88.9 %
PATIENT TEMPERATURE: 37
PH ART: 7.425 (ref 7.350–7.450)
PH ART: 7.224 — AB (ref 7.350–7.450)
PO2 ART: 73.7 mmHg — AB (ref 80.0–100.0)
Patient temperature: 37
TCO2: 13 mmol/L (ref 0–100)
TCO2: 14 mmol/L (ref 0–100)
pCO2 arterial: 24 mmHg — ABNORMAL LOW (ref 35.0–45.0)
pCO2 arterial: 38.8 mmHg (ref 35.0–45.0)
pO2, Arterial: 63.3 mmHg — ABNORMAL LOW (ref 80.0–100.0)

## 2013-06-05 LAB — BASIC METABOLIC PANEL
BUN: 43 mg/dL — ABNORMAL HIGH (ref 6–23)
CALCIUM: 8.6 mg/dL (ref 8.4–10.5)
CO2: 19 mEq/L (ref 19–32)
Chloride: 92 mEq/L — ABNORMAL LOW (ref 96–112)
Creatinine, Ser: 2.55 mg/dL — ABNORMAL HIGH (ref 0.50–1.35)
GFR calc Af Amer: 28 mL/min — ABNORMAL LOW (ref >90)
GFR, EST NON AFRICAN AMERICAN: 24 mL/min — AB (ref >90)
Glucose, Bld: 182 mg/dL — ABNORMAL HIGH (ref 70–99)
POTASSIUM: 3.8 meq/L (ref 3.7–5.3)
SODIUM: 131 meq/L — AB (ref 137–147)

## 2013-06-05 LAB — HEPATIC FUNCTION PANEL
ALT: 13 U/L (ref 0–53)
AST: 27 U/L (ref 0–37)
Albumin: 2.8 g/dL — ABNORMAL LOW (ref 3.5–5.2)
Alkaline Phosphatase: 48 U/L (ref 39–117)
BILIRUBIN INDIRECT: 0.7 mg/dL (ref 0.3–0.9)
Bilirubin, Direct: 0.4 mg/dL — ABNORMAL HIGH (ref 0.0–0.3)
TOTAL PROTEIN: 6.3 g/dL (ref 6.0–8.3)
Total Bilirubin: 1.1 mg/dL (ref 0.3–1.2)

## 2013-06-05 LAB — PRO B NATRIURETIC PEPTIDE: PRO B NATRI PEPTIDE: 4450 pg/mL — AB (ref 0–125)

## 2013-06-05 LAB — TROPONIN I

## 2013-06-05 LAB — LIPASE, BLOOD: LIPASE: 8 U/L — AB (ref 11–59)

## 2013-06-05 LAB — LACTIC ACID, PLASMA: Lactic Acid, Venous: 5.1 mmol/L — ABNORMAL HIGH (ref 0.5–2.2)

## 2013-06-05 LAB — CBG MONITORING, ED: GLUCOSE-CAPILLARY: 140 mg/dL — AB (ref 70–99)

## 2013-06-05 MED ORDER — ROCURONIUM BROMIDE 50 MG/5ML IV SOLN
INTRAVENOUS | Status: AC
  Filled 2013-06-05: qty 2

## 2013-06-05 MED ORDER — VANCOMYCIN HCL IN DEXTROSE 1-5 GM/200ML-% IV SOLN
1000.0000 mg | Freq: Once | INTRAVENOUS | Status: AC
  Administered 2013-06-05: 1000 mg via INTRAVENOUS
  Filled 2013-06-05: qty 200

## 2013-06-05 MED ORDER — SUCCINYLCHOLINE CHLORIDE 20 MG/ML IJ SOLN
INTRAMUSCULAR | Status: AC
  Filled 2013-06-05: qty 1

## 2013-06-05 MED ORDER — MIDAZOLAM HCL 2 MG/2ML IJ SOLN
INTRAMUSCULAR | Status: AC
  Filled 2013-06-05: qty 2

## 2013-06-05 MED ORDER — SODIUM CHLORIDE 0.9 % IV SOLN
Freq: Once | INTRAVENOUS | Status: AC
  Administered 2013-06-05: 18:00:00 via INTRAVENOUS

## 2013-06-05 MED ORDER — LEVOFLOXACIN IN D5W 500 MG/100ML IV SOLN
500.0000 mg | Freq: Once | INTRAVENOUS | Status: AC
  Administered 2013-06-05: 500 mg via INTRAVENOUS
  Filled 2013-06-05: qty 100

## 2013-06-05 MED ORDER — SODIUM CHLORIDE 0.9 % IV BOLUS (SEPSIS)
1000.0000 mL | Freq: Once | INTRAVENOUS | Status: AC
  Administered 2013-06-05: 1000 mL via INTRAVENOUS

## 2013-06-05 MED ORDER — ASPIRIN 81 MG PO CHEW
324.0000 mg | CHEWABLE_TABLET | Freq: Once | ORAL | Status: AC
  Administered 2013-06-05: 324 mg via ORAL
  Filled 2013-06-05: qty 4

## 2013-06-05 MED ORDER — PROPOFOL 10 MG/ML IV EMUL
5.0000 ug/kg/min | INTRAVENOUS | Status: DC
  Administered 2013-06-05: 25 ug/kg/min via INTRAVENOUS
  Administered 2013-06-06: 5 ug/kg/min via INTRAVENOUS
  Filled 2013-06-05 (×2): qty 100

## 2013-06-05 MED ORDER — LIDOCAINE HCL (CARDIAC) 20 MG/ML IV SOLN
INTRAVENOUS | Status: AC
  Filled 2013-06-05: qty 5

## 2013-06-05 MED ORDER — PIPERACILLIN-TAZOBACTAM 3.375 G IVPB
3.3750 g | Freq: Once | INTRAVENOUS | Status: AC
  Administered 2013-06-05: 3.375 g via INTRAVENOUS
  Filled 2013-06-05: qty 50

## 2013-06-05 MED ORDER — NOREPINEPHRINE BITARTRATE 1 MG/ML IJ SOLN
INTRAMUSCULAR | Status: AC
  Filled 2013-06-05: qty 4

## 2013-06-05 MED ORDER — MAGNESIUM SULFATE 40 MG/ML IJ SOLN
2.0000 g | Freq: Once | INTRAMUSCULAR | Status: DC
  Filled 2013-06-05: qty 50

## 2013-06-05 MED ORDER — SODIUM CHLORIDE 0.9 % IV SOLN
0.0000 ug/h | INTRAVENOUS | Status: DC
  Administered 2013-06-06: 100 ug/h via INTRAVENOUS
  Administered 2013-06-06: 200 ug/h via INTRAVENOUS
  Administered 2013-06-06: 100 ug/h via INTRAVENOUS
  Administered 2013-06-08: 75 ug/h via INTRAVENOUS
  Filled 2013-06-05 (×5): qty 50

## 2013-06-05 MED ORDER — MIDAZOLAM HCL 2 MG/2ML IJ SOLN
2.0000 mg | Freq: Once | INTRAMUSCULAR | Status: AC
  Administered 2013-06-05: 2 mg via INTRAVENOUS

## 2013-06-05 MED ORDER — ETOMIDATE 2 MG/ML IV SOLN
INTRAVENOUS | Status: AC
  Filled 2013-06-05: qty 20

## 2013-06-05 MED ORDER — SODIUM CHLORIDE 0.9 % IV SOLN: INTRAVENOUS | Status: DC

## 2013-06-05 MED ORDER — PROPOFOL 10 MG/ML IV EMUL
INTRAVENOUS | Status: AC
  Filled 2013-06-05: qty 100

## 2013-06-05 MED ORDER — SODIUM CHLORIDE 0.9 % IV SOLN
2.0000 mg/h | INTRAVENOUS | Status: DC
  Filled 2013-06-05: qty 10

## 2013-06-05 MED ORDER — SODIUM CHLORIDE 0.9 % IV SOLN
Freq: Once | INTRAVENOUS | Status: AC
Start: 2013-06-05 — End: 2013-06-05
  Administered 2013-06-05: 19:00:00 via INTRAVENOUS

## 2013-06-05 MED ORDER — MAGNESIUM SULFATE 40 MG/ML IJ SOLN
2.0000 g | Freq: Once | INTRAMUSCULAR | Status: AC
  Administered 2013-06-05: 2 g via INTRAVENOUS

## 2013-06-05 MED ORDER — DEXTROSE 5 % IV SOLN
2.0000 ug/min | INTRAVENOUS | Status: DC
  Administered 2013-06-05: 2 ug/min via INTRAVENOUS
  Administered 2013-06-06: 35 ug/min via INTRAVENOUS
  Filled 2013-06-05 (×2): qty 4

## 2013-06-05 NOTE — ED Notes (Signed)
RN and family at bedside since pt arrival.

## 2013-06-05 NOTE — ED Notes (Signed)
Pt received 20mg  of etomidate and 100 mg of succinylcholine at 2200 for sedation.

## 2013-06-05 NOTE — Progress Notes (Signed)
Patient taken off BIPAP per request and improved condition.  Discussed with MD.  Patient placed back on 4 lpm O2 sat 99%.

## 2013-06-05 NOTE — ED Provider Notes (Signed)
CSN: [id number]     Arrival date & time 05/17/2013  1709 History  This chart was scribed for Troy Essex, MD by Jenne Campus, ED Scribe. This patient was seen in room APA14/APA14 and the patient's care was started at 5:28 PM.   Chief Complaint  Patient presents with  . Shortness of Breath    Level 5 Caveat-Severe Respiratory Distress  The history is provided by the patient, the spouse and a relative. No language interpreter was used.    HPI Comments: Troy Perez is a 68 y.o. male brought in by ambulance with a h/o COPD, who presents to the Emergency Department complaining of gradual onset, constant SOB for the past 2 days with associated cough, chest tightness and lower back pain attributed to coughing. He was seen by his PCP yesterday for the same and was started on prednisone and azithromycin for Bronchitis without improvement. EMS was called tonight when his breathing rapidly worsened over the past 2 hours. He was given albuterol, duoneb and 125 mg solumedrol en route without improvement. He denies any prior episodes of the same and denies wearing O2 at home. He denies any h/o DVT or PE and h/o cardiac problems including MI. He denies any acute CP, upper back pain or leg swelling. He has a h/o bladder CA that was removed with a urostomy 3 years ago with his last chemo tx being 4 years ago. He is CA free currently, last oncology appt was in December 2014. He denies any recent admissions stating that his last admission was for the urostomy 3 years ago.   PCP is Dr. Redmond Pulling.  Past Medical History  Diagnosis Date  . Malignant neoplasm of bladder, part unspecified   . Diabetes mellitus    History reviewed. No pertinent past surgical history. No family history on file. History  Substance Use Topics  . Smoking status: Current Every Day Smoker -- 1.00 packs/day for 52 years  . Smokeless tobacco: Not on file  . Alcohol Use: Not on file    Review of Systems  Unable to perform ROS:  Severe respiratory distress    Allergies  Review of patient's allergies indicates no known allergies.  Home Medications   No current outpatient prescriptions on file. Triage Vitals: BP 90/69  Pulse 134  Temp(Src) 98.6 F (37 C)  Resp 30  Ht 5\' 7"  (1.702 m)  Wt 160 lb (72.576 kg)  BMI 25.05 kg/m2  SpO2 95%  Physical Exam  Nursing note and vitals reviewed. Constitutional: He is oriented to person, place, and time. He appears well-developed and well-nourished. He appears distressed.  HENT:  Head: Normocephalic and atraumatic.  Mouth/Throat: Oropharynx is clear and moist. No oropharyngeal exudate.  Eyes: Conjunctivae and EOM are normal. Pupils are equal, round, and reactive to light.  Neck: Neck supple. No tracheal deviation present.  Cardiovascular: Regular rhythm and normal heart sounds.  Tachycardia present.   No murmur heard. Pulmonary/Chest: He is in respiratory distress (moderate to severe).  Course rhonchi throughout with scattered wheezing. Decreased air movement on the left. Tachypnea with abdominal breathing  Abdominal: Soft. There is no tenderness. There is no rebound and no guarding.  Colostomy present  Musculoskeletal: Normal range of motion. He exhibits no edema and no tenderness.  Intact peripheral pulses, no peripheral edema  Neurological: He is alert and oriented to person, place, and time. No cranial nerve deficit. He exhibits normal muscle tone. Coordination normal.  Skin: Skin is warm. He is diaphoretic.  Psychiatric: He has  a normal mood and affect. His behavior is normal.    ED Course  Procedures (including critical care time)  Medications  norepinephrine (LEVOPHED) 4 mg in dextrose 5 % 250 mL infusion (5 mcg/min Intravenous Rate/Dose Change 06/06/13 0039)  lidocaine (cardiac) 100 mg/725ml (XYLOCAINE) 20 MG/ML injection 2% (not administered)  succinylcholine (ANECTINE) 20 MG/ML injection (not administered)  rocuronium (ZEMURON) 50 MG/5ML injection (not  administered)  etomidate (AMIDATE) 2 MG/ML injection (not administered)  propofol (DIPRIVAN) 10 mg/ml infusion (25 mcg/kg/min  72.6 kg Intravenous New Bag/Given 04-03-2013 2227)  propofol (DIPRIVAN) 10 mg/ml infusion (not administered)  fentaNYL (SUBLIMAZE) 10 mcg/mL in sodium chloride 0.9 % 250 mL infusion (not administered)  midazolam (VERSED) 2 MG/2ML injection (not administered)  pantoprazole (PROTONIX) injection 40 mg (not administered)  heparin injection 5,000 Units (not administered)  albuterol (PROVENTIL) (2.5 MG/3ML) 0.083% nebulizer solution 2.5 mg (not administered)  ipratropium (ATROVENT) nebulizer solution 0.5 mg (not administered)  albuterol (PROVENTIL) (2.5 MG/3ML) 0.083% nebulizer solution 2.5 mg (not administered)  budesonide (PULMICORT) nebulizer solution 0.25 mg (not administered)  vancomycin (VANCOCIN) IVPB 1000 mg/200 mL premix (not administered)  ceFEPIme (MAXIPIME) 1 g in dextrose 5 % 50 mL IVPB (not administered)  levofloxacin (LEVAQUIN) IVPB 500 mg (not administered)  0.9 %  sodium chloride infusion ( Intravenous Stopped 04-03-2013 1842)  aspirin chewable tablet 324 mg (324 mg Oral Given 04-03-2013 1759)  sodium chloride 0.9 % bolus 1,000 mL (0 mLs Intravenous Stopped 04-03-2013 1957)  magnesium sulfate IVPB 2 g 50 mL (0 g Intravenous Stopped 04-03-2013 1851)  vancomycin (VANCOCIN) IVPB 1000 mg/200 mL premix (0 mg Intravenous Stopped 04-03-2013 1929)  piperacillin-tazobactam (ZOSYN) IVPB 3.375 g (0 g Intravenous Stopped 04-03-2013 1847)  0.9 %  sodium chloride infusion ( Intravenous New Bag/Given 04-03-2013 1842)  levofloxacin (LEVAQUIN) IVPB 500 mg (0 mg Intravenous Stopped 04-03-2013 2037)  sodium chloride 0.9 % bolus 1,000 mL (0 mLs Intravenous Stopped 04-03-2013 2037)  midazolam (VERSED) injection 2 mg (2 mg Intravenous Given 04-03-2013 2239)     DIAGNOSTIC STUDIES: Oxygen Saturation is 95% on RA, adequate by my interpretation.    COORDINATION OF CARE: 5:30 PM- cardiac US is negative.    5:34 PM-Discussed treatment plan which includes IV fluids, CXR, CBC panel, CMP and UA with pt at bedside and pt agreed to plan. Pt agrees to intubation and CPR if needed. Preliminary CXR reading in the room shows PNA in left lung.   5:58 PM-Pt rechecked and feels improved on the Bipap. Discussed possible transfer to [facility] for ICU care with pt and family and both agreed.   6:25 PM- Pt rechecked and feels improved on Bipap. HR is improving, in the 110s from 140s. Work of breathing has improved. Pt agrees to central line if necessary.   6:59 PM- Pt rechecked and BP is 91/53 after 2L IV fluids. He is feeling improved since Bipap. Started on O2. Reports minimal left upper rib pain currently.    7:15 PM-Consult complete with Dr. Craige CottaSood, intensivist. Patient case explained and discussed. Dr. Craige CottaSood agrees to admit patient to [facility] ICU for further evaluation and treatment. Call ended at 7:17 PM  9:03 PM- Pt rechecked and appears agitated on the Bipap. Will try pt on Aubrey for comfort. Pt does not clinically appear to need intubation at this time. Ordered repeat CXR and ABG.   9:42 PM- Repeat ABG is lower than original and repeat CXR shows worsening of PNA. Discussed intubation with pt and pt is agreeable. He states that he  feels worse. Dr. Halford Chessman evaluated the pt via camera and agrees with clinical decision to intubate.   Labs Review Labs Reviewed  CBC WITH DIFFERENTIAL - Abnormal; Notable for the following:    Neutrophils Relative % 78 (*)    Lymphocytes Relative 7 (*)    Monocytes Relative 15 (*)    Lymphs Abs 0.5 (*)    Monocytes Absolute 1.1 (*)    All other components within normal limits  BASIC METABOLIC PANEL - Abnormal; Notable for the following:    Sodium 131 (*)    Chloride 92 (*)    Glucose, Bld 182 (*)    BUN 43 (*)    Creatinine, Ser 2.55 (*)    GFR calc non Af Amer 24 (*)    GFR calc Af Amer 28 (*)    All other components within normal limits  BLOOD GAS, ARTERIAL - Abnormal; Notable  for the following:    pCO2 arterial 24.0 (*)    pO2, Arterial 73.7 (*)    Bicarbonate 15.5 (*)    Acid-base deficit 8.1 (*)    All other components within normal limits  LACTIC ACID, PLASMA - Abnormal; Notable for the following:    Lactic Acid, Venous 5.1 (*)    All other components within normal limits  PRO B NATRIURETIC PEPTIDE - Abnormal; Notable for the following:    Pro B Natriuretic peptide (BNP) 4450.0 (*)    All other components within normal limits  HEPATIC FUNCTION PANEL - Abnormal; Notable for the following:    Albumin 2.8 (*)    Bilirubin, Direct 0.4 (*)    All other components within normal limits  LIPASE, BLOOD - Abnormal; Notable for the following:    Lipase 8 (*)    All other components within normal limits  BLOOD GAS, ARTERIAL - Abnormal; Notable for the following:    pH, Arterial 7.224 (*)    pO2, Arterial 63.3 (*)    Bicarbonate 15.4 (*)    Acid-base deficit 10.7 (*)    All other components within normal limits  GLUCOSE, CAPILLARY - Abnormal; Notable for the following:    Glucose-Capillary 138 (*)    All other components within normal limits  CBG MONITORING, ED - Abnormal; Notable for the following:    Glucose-Capillary 140 (*)    All other components within normal limits  CULTURE, BLOOD (ROUTINE X 2)  CULTURE, BLOOD (ROUTINE X 2)  MRSA PCR SCREENING  CULTURE, RESPIRATORY (NON-EXPECTORATED)  TROPONIN I  URINALYSIS, ROUTINE W REFLEX MICROSCOPIC  STREP PNEUMONIAE URINARY ANTIGEN  LEGIONELLA ANTIGEN, URINE  BASIC METABOLIC PANEL  CBC   Imaging Review Dg Chest Portable 1 View  05/23/2013   CLINICAL DATA:  Short of breath, chest pain, history of bladder cancer  EXAM: PORTABLE CHEST - 1 VIEW  COMPARISON:  Prior CT chest/abdomen/ pelvis 09/28/2012  FINDINGS: Dense wedge-shaped peripheral consolidation abuts the lateral pleura of the left hemi thorax. There is additional diffuse interstitial and airspace opacities throughout the left upper lobe. The left lower  lobe appears relatively spared. No evidence of mediastinal shift to suggest volume loss. Cardiac and mediastinal contours are within normal limits. Atherosclerotic calcifications are noted in the transverse aorta. The right lung remains clear. There is a small left-sided pleural effusion. No acute osseous abnormality.  IMPRESSION: Radiographic findings are most consistent with a left upper lobar pneumonia and small parapneumonic effusion.  Metastatic disease and pulmonary infarct/hemorrhage related to pulmonary embolus are considered significantly less likely given the sparing of the  right lung.  Given the prior history of bladder cancer, recommend imaging followup to resolution.   Electronically Signed   By: Jacqulynn Cadet M.D.   On: 05/25/2013 17:47     EKG Interpretation   Date/Time:  Tuesday June 05 2013 17:15:33 EDT Ventricular Rate:  135 PR Interval:  126 QRS Duration: 82 QT Interval:  286 QTC Calculation: 429 R Axis:   95 Text Interpretation:  Sinus tachycardia Right atrial enlargement Rightward  axis Pulmonary disease pattern Nonspecific ST and T wave abnormality  Abnormal ECG When compared with ECG of 16-Nov-2009 16:44, ST no longer  depressed in Lateral leads No significant change was found Confirmed by  Wyvonnia Dusky  MD, [place] [phone]) on 05/14/2013 5:26:48 PM      MDM   Final diagnoses:  Sepsis  Respiratory failure  CAP (community acquired pneumonia)  Renal failure   acute onset of shortness of breath coughing and wheezing for the past 2 days. Treated for COPD exacerbation by PCP yesterday. Patient arrives in respiratory distress, tachycardic to the 130s, tachypnea to 40 with blood pressure 90/69. Endorses some chest tightness. EKG with sinus tachycardia.  Patient placed on BiPAP for work of breathing on arrival. Decreased breath sounds on the left with scattered rhonchi. Nebs, steroids, magnesium given. Chest x-ray shows dense infiltrate in the left with opacification of  left hemithorax.  Bedside ultrasound shows no pericardial effusion or the right heart strain. Lactate 5, Cr 2.5.  Patient started on broad-spectrum antibiotics, blood cultures obtained. ABG shows no CO2 retention but metabolic acidosis. Blood pressure remained soft and 62-70 systolic. Tachycardia the 130s. After 2 L IV fluid, patient's blood pressure has improved to 350-093 systolic. Heart rate improved to 110s. Work of breathing improved on BiPAP. Patient requests removal after period time. He appears tachypneic but comfortable on 4L [place].  Repeat ABG shows worsening acidosis. His work of breathing remains severe with tachypnea but no hypoxia. Blood pressure 818 systolic.  Discussed the patient, patient family, and Dr. Halford Chessman, all parties agree that patient should be intubated for transfer. Patient tolerated intubation well. Transient hypotension to 70s after intubation improved.  Small dose levophed started prior to transfer.    EMERGENCY DEPARTMENT [place] CARDIAC EXAM "Study: Limited Ultrasound of the heart and pericardium"  INDICATIONS:Hypotension, Tachycardia, Unstable Vital Signs and Dyspnea Multiple views of the heart and pericardium are obtained with a multi-frequency probe.  PERFORMED EX:HBZJIR  IMAGES ARCHIVED?: Yes  FINDINGS: No pericardial effusion, Normal contractility and Tamponade physiology absent  LIMITATIONS:  Emergent procedure  VIEWS USED: Subcostal 4 chamber and Parasternal long axis  INTERPRETATION: Cardiac activity present, Pericardial effusioin absent and Cardiac tamponade absent  COMMENT:  No R heart strain, no pericardial effusion  EMERGENT INTUBATION PROCEDURE NOTE INDICATION: respiratory failure  TECHNIQUE: Risks of procedure as well as the alternatives and risks of each were explained to the (patient/caregiver).  Consent for procedure obtained.  After pre-oxygenating the patient for 3 minutes, a modified rapid-sequence induction was performed using  etomidate and succinylcholine with cricoid pressure. Using a Glidscope and 7.88mm cuffed endotracheal tube was placed and secured at 24 mm at the lip.  Placement was confirmed with by auscultation and by CXR.  COMPLICATIONS: None. The patient tolerated the procedure well with no complications. POST PROCEDURE CXR: tube position acceptable   CRITICAL CARE Performed by: Troy Essex, MD Total critical care time: 45 Critical care time was exclusive of separately billable procedures and treating other patients. Critical care was necessary to treat or prevent  imminent or life-threatening deterioration. Critical care was time spent personally by me on the following activities: development of treatment plan with patient and/or surrogate as well as nursing, discussions with consultants, evaluation of patient's response to treatment, examination of patient, obtaining history from patient or surrogate, ordering and performing treatments and interventions, ordering and review of laboratory studies, ordering and review of radiographic studies, pulse oximetry and re-evaluation of patient's condition.    I personally performed the services described in this documentation, which was scribed in my presence. The recorded information has been reviewed and is accurate.     Glynn Octave, MD 06/06/13 [phone]

## 2013-06-05 NOTE — ED Notes (Signed)
Pt given ice chips per EDP request

## 2013-06-05 NOTE — H&P (Signed)
PULMONARY / CRITICAL CARE MEDICINE   Name: Troy Perez MRN: [id number] DOB: 1945-05-24    ADMISSION DATE:  06/02/2013 CONSULTATION DATE:  3/24  REFERRING MD :  Dr. Wyvonnia Dusky PRIMARY SERVICE: PCCM  CHIEF COMPLAINT:  Acute Respiratory Failure   BRIEF PATIENT DESCRIPTION: 68 y/o M with PMH of COPD presented 3/24 with L PNA, progressed to resp fx in [facility] ER, intubated & tx to [facility] for further care.   SIGNIFICANT EVENTS:    3/24 presented to [facility] ER with 48 h hx of increasing cough, SOB, chest tightness and low back pain.  Found to have L PNA, Progressed to resp fx requiring intubation.   STUDIES: 3/24 - CXR >> diffuse interstitial opacities in L lung  LINES / TUBES: OETT 3/24>>>  CULTURES: U. Strep 3/24>>> UA 3/24>>>3-6 wbc, mod leuk's, neg nitrate, many bacteria Sputum 3/24>>> BCx2 3/24>>>  ANTIBIOTICS: Vanc 3/24 >>  Levoflox 3/24 >>  Cefepime 3/24 >>   HISTORY OF PRESENT ILLNESS:  68 y/o M with PMH of DM, bladder cancer s/p urostomy in 2012 with last chemo in 2011 (reportedly cancer free with last ONC appt in 02/2013) and COPD presented 3/24 to [facility] ER on 3/24 with progressive cough, increased shortness of breath, chest tightness and low back pain from coughing.  He was seen by his PCP on 3/23 for similar complaints and treated with prednisone & azithromycin without improvement.  Patient activated EMS afternoon of 3/24 with approximately 2 hours of worsening symptoms. EMS treated the patient with bronchodilators and 125 mg of solumedrol without improvement.  Patient presented to the ER in respiratory distress - diaphoretic with associated tachycardia.  Initial CXR noted L diffuse interstitial opacities.  He was placed on bipap with improvement in symptoms.  He later became agitated and was taken off bipap.  Follow up ABG at that time noted worsening of oxygenation & acidosis.  Decision was made to intubate patient prior to transfer.  Patient was intubated in ER & tx to [facility]  for further evaluation.  Other work up notes:  Na 131, sr Cr 2.55 (up from baseline of ~1.4), glucose of 182, albumin 2.8, troponin < 0.30, proBNP 4450, lactate of 5.1, wbc 7.1, platelets 240 and hgb 16.2.      PAST MEDICAL HISTORY :  Past Medical History  Diagnosis Date  . Malignant neoplasm of bladder, part unspecified   . Diabetes mellitus    History reviewed. No pertinent past surgical history. Prior to Admission medications   Medication Sig Start Date End Date Taking? Authorizing Provider  acetaminophen (TYLENOL) 500 MG tablet Take 1,000 mg by mouth once as needed.   Yes Historical Provider, MD  azithromycin (ZITHROMAX) 250 MG tablet Take 250-500 mg by mouth See admin instructions. Two tablet tablets (500mg  total) by mouth on day 1, then take one tablet daily (250mg  total) every day for 4 days thereafter 06/04/13  Yes Historical Provider, MD  benzonatate (TESSALON) 100 MG capsule Take 100 mg by mouth 3 (three) times daily as needed. For cough 05/11/13  Yes Historical Provider, MD  Fluticasone-Salmeterol (ADVAIR) 250-50 MCG/DOSE AEPB Inhale 1 puff into the lungs 2 (two) times daily.   Yes Historical Provider, MD  predniSONE (DELTASONE) 20 MG tablet Take 20-40 mg by mouth daily. 9 day supply starting on 06/04/2013 06/04/13  Yes Historical Provider, MD  vitamin C (ASCORBIC ACID) 500 MG tablet Take 500 mg by mouth daily.   Yes Historical Provider, MD   No Known Allergies  FAMILY  HISTORY:  No family history on file.  SOCIAL HISTORY:  reports that he has been smoking.  He does not have any smokeless tobacco history on file. His alcohol and drug histories are not on file.  REVIEW OF SYSTEMS:  Unable to complete as pt is on vent.  Information obtained from previous medical documentation.   SUBJECTIVE:   VITAL SIGNS: Temp:  [97.4 F (36.3 C)-100.1 F (37.8 C)] 97.4 F (36.3 C) (03/24 2215) Pulse Rate:  [113-134] 113 (03/24 2229) Resp:  [22-34] 22 (03/24 2224) BP: (68-110)/(45-69) 80/58  mmHg (03/24 2229) SpO2:  [95 %-100 %] 99 % (03/24 2215) FiO2 (%):  [40 %-50 %] 50 % (03/24 2218) Weight:  [160 lb (72.576 kg)] 160 lb (72.576 kg) (03/24 1714)  HEMODYNAMICS:    VENTILATOR SETTINGS: Vent Mode:  [-] PRVC FiO2 (%):  [40 %-50 %] 50 % Set Rate:  [20 bmp] 20 bmp Vt Set:  [500 mL] 500 mL PEEP:  [5 cmH20] 5 cmH20 Plateau Pressure:  [21 cmH20] 21 cmH20  INTAKE / OUTPUT: Intake/Output   None     PHYSICAL EXAMINATION: General:  Intubated, sedated Neuro:  MAEs, no focal deficits HEENT: NCAT, EOMI. PERRL Cardiovascular:  RRR s  Lungs: diffuse coarse rhonchi, no wheezes noted Abdomen:  Soft, NT, NABS Ext: warm, no edema  LABS:  CBC  Recent Labs Lab 06/23/13 1818  WBC 7.1  HGB 16.2  HCT 46.0  PLT 240   Coag's No results found for this basename: APTT, INR,  in the last 168 hours BMET  Recent Labs Lab 06-23-13 1818  NA 131*  K 3.8  CL 92*  CO2 19  BUN 43*  CREATININE 2.55*  GLUCOSE 182*   Electrolytes  Recent Labs Lab 06-23-13 1818  CALCIUM 8.6   Sepsis Markers  Recent Labs Lab 06/23/2013 1818  LATICACIDVEN 5.1*   ABG  Recent Labs Lab 06-23-13 1733 2013/06/23 2104  PHART 7.425 7.224*  PCO2ART 24.0* 38.8  PO2ART 73.7* 63.3*   Liver Enzymes  Recent Labs Lab 06/23/2013 1818  AST 27  ALT 13  ALKPHOS 48  BILITOT 1.1  ALBUMIN 2.8*   Cardiac Enzymes  Recent Labs Lab 23-Jun-2013 1818  TROPONINI <0.30  PROBNP 4450.0*   Glucose  Recent Labs Lab 06-23-2013 1811  GLUCAP 140*    Imaging Dg Chest Portable 1 View  06/23/13   CLINICAL DATA:  Short of breath, chest pain, history of bladder cancer  EXAM: PORTABLE CHEST - 1 VIEW  COMPARISON:  Prior CT chest/abdomen/ pelvis 09/28/2012  FINDINGS: Dense wedge-shaped peripheral consolidation abuts the lateral pleura of the left hemi thorax. There is additional diffuse interstitial and airspace opacities throughout the left upper lobe. The left lower lobe appears relatively spared. No  evidence of mediastinal shift to suggest volume loss. Cardiac and mediastinal contours are within normal limits. Atherosclerotic calcifications are noted in the transverse aorta. The right lung remains clear. There is a small left-sided pleural effusion. No acute osseous abnormality.  IMPRESSION: Radiographic findings are most consistent with a left upper lobar pneumonia and small parapneumonic effusion.  Metastatic disease and pulmonary infarct/hemorrhage related to pulmonary embolus are considered significantly less likely given the sparing of the right lung.  Given the prior history of bladder cancer, recommend imaging followup to resolution.   Electronically Signed   By: Jacqulynn Cadet M.D.   On: 06-23-2013 17:47   Dg Chest Port 1v Same Day  Jun 23, 2013   CLINICAL DATA:  Shortness of breath, intubation.  EXAM:  PORTABLE CHEST - 1 VIEW SAME DAY  COMPARISON:  DG CHEST PORT 1VSAME DAY dated 05/23/2013 2117 hr  FINDINGS: Interval intubation, distal tip projects 4.4 cm above the carina. Similar interstitial and alveolar airspace opacities in left lung, left costophrenic angle is incompletely imaged. Right lung remains clear with mild interstitial prominence. Cardiothymic silhouette is nonsuspicious. No pneumothorax in the included view of the left lung.  IMPRESSION: Endotracheal tube tip projects 4.4 cm above the carina.  Stable appearance of left chest suggesting pneumonia. Recommend follow-up chest radiograph to verify improvement.   Electronically Signed   By: Elon Alas   On: 06/04/2013 22:41   Dg Chest Port 1v Same Day  05/18/2013   CLINICAL DATA:  Shortness of breath.  Removed from BiPAP.  EXAM: PORTABLE CHEST - 1 VIEW SAME DAY  COMPARISON:  05/26/2013  FINDINGS: Unchanged appearance of dense consolidation in the left mid and upper lung. Unchanged appearance of a small peripheral lucency at the left base which is likely related to accessory fissure or scarring based on stability and previous CT  imaging. There is a small left pleural effusion. The right lung remains well aerated. No cardiomegaly.  IMPRESSION: 1. Unchanged lung aeration after after cessation of BiPAP. 2. Unchanged, extensive left pneumonia.   Electronically Signed   By: Jorje Guild M.D.   On: 05/22/2013 21:58    ASSESSMENT / PLAN:  PULMONARY A: Acute Respiratory Failure  Severe PNA COPD P:   -full vent support, 8cc/kg -f/u CXR / ABG -scheduled bronchodilators -hold home advair -daily WUA / SBT  CARDIOVASCULAR A:  Septic Shock P:  -repeat lactic acid on arrival -levophed for MAP > 65  RENAL A:   Anion Gap Acidosis - elevated lactate Acute Kidney Injury Hx of Bladder CA s/p Urostomy  P:   Monitor BMET intermittently Monitor I/Os Correct electrolytes as indicated  GASTROINTESTINAL A:   No acute issues P:   -NPO -TF per protocol 3/25  HEMATOLOGIC A:   No issues P:  DVT px: SQ heparin Monitor CBC intermittently Transfuse per usual ICU guidelines  INFECTIOUS A:   Severe CAP  Severe sepsis H/O pseudomonas PNA P:   Micro and abx as above  ENDOCRINE A:   DM 2  P:   CBGs/SSI q 4 hrs  NEUROLOGIC A:   Acute Encephalopathy, septic P:   Propofol gtt Fentanyl gtt  Noe Gens, NP-C  Pulmonary & Critical Care Pgr: [phone] or [phone]    I have personally obtained a history, examined the patient, evaluated laboratory and imaging results, formulated the assessment and plan and placed orders.  CRITICAL CARE: The patient is critically ill with multiple organ systems failure and requires high complexity decision making for assessment and support, frequent evaluation and titration of therapies, application of advanced monitoring technologies and extensive interpretation of multiple databases. Critical Care Time devoted to patient care services described in this note is 45 minutes.   Merton Border, MD ; [facility] [phone].  After 5:30 PM or weekends, call  [phone]  06/07/2013, 11:11 PM

## 2013-06-05 NOTE — ED Notes (Signed)
Pt c/o sob and generalized aches since Sunday night. Pt seen by pcp yesterday-dx with Bronchitis and started on prednisone and Advair. Pt reports increasing sob and chest tightness today. Pt given albuterol and duoneb, and 125 solumedrol iv by EMS in route. nad noted.

## 2013-06-06 ENCOUNTER — Inpatient Hospital Stay (HOSPITAL_COMMUNITY): Payer: Medicare HMO

## 2013-06-06 DIAGNOSIS — R6521 Severe sepsis with septic shock: Secondary | ICD-10-CM

## 2013-06-06 DIAGNOSIS — N179 Acute kidney failure, unspecified: Secondary | ICD-10-CM

## 2013-06-06 DIAGNOSIS — A419 Sepsis, unspecified organism: Secondary | ICD-10-CM | POA: Diagnosis present

## 2013-06-06 DIAGNOSIS — J9600 Acute respiratory failure, unspecified whether with hypoxia or hypercapnia: Secondary | ICD-10-CM

## 2013-06-06 DIAGNOSIS — J189 Pneumonia, unspecified organism: Secondary | ICD-10-CM | POA: Insufficient documentation

## 2013-06-06 DIAGNOSIS — R6520 Severe sepsis without septic shock: Secondary | ICD-10-CM | POA: Diagnosis present

## 2013-06-06 LAB — URINALYSIS, ROUTINE W REFLEX MICROSCOPIC
Bilirubin Urine: NEGATIVE
Glucose, UA: NEGATIVE mg/dL
Ketones, ur: NEGATIVE mg/dL
NITRITE: NEGATIVE
PH: 6 (ref 5.0–8.0)
Protein, ur: 100 mg/dL — AB
SPECIFIC GRAVITY, URINE: 1.022 (ref 1.005–1.030)
Urobilinogen, UA: 1 mg/dL (ref 0.0–1.0)

## 2013-06-06 LAB — POCT I-STAT 3, ART BLOOD GAS (G3+)
Acid-base deficit: 13 mmol/L — ABNORMAL HIGH (ref 0.0–2.0)
Acid-base deficit: 9 mmol/L — ABNORMAL HIGH (ref 0.0–2.0)
BICARBONATE: 16 meq/L — AB (ref 20.0–24.0)
BICARBONATE: 19.4 meq/L — AB (ref 20.0–24.0)
O2 SAT: 91 %
O2 Saturation: 99 %
PCO2 ART: 52.6 mmHg — AB (ref 35.0–45.0)
PH ART: 7.148 — AB (ref 7.350–7.450)
Patient temperature: 99.3
TCO2: 17 mmol/L (ref 0–100)
TCO2: 21 mmol/L (ref 0–100)
pCO2 arterial: 46.3 mmHg — ABNORMAL HIGH (ref 35.0–45.0)
pH, Arterial: 7.177 — CL (ref 7.350–7.450)
pO2, Arterial: 175 mmHg — ABNORMAL HIGH (ref 80.0–100.0)
pO2, Arterial: 77 mmHg — ABNORMAL LOW (ref 80.0–100.0)

## 2013-06-06 LAB — BASIC METABOLIC PANEL WITH GFR
BUN: 47 mg/dL — ABNORMAL HIGH (ref 6–23)
CO2: 18 meq/L — ABNORMAL LOW (ref 19–32)
Calcium: 7.4 mg/dL — ABNORMAL LOW (ref 8.4–10.5)
Chloride: 97 meq/L (ref 96–112)
Creatinine, Ser: 2.45 mg/dL — ABNORMAL HIGH (ref 0.50–1.35)
GFR calc Af Amer: 29 mL/min — ABNORMAL LOW
GFR calc non Af Amer: 25 mL/min — ABNORMAL LOW
Glucose, Bld: 176 mg/dL — ABNORMAL HIGH (ref 70–99)
Potassium: 4.4 meq/L (ref 3.7–5.3)
Sodium: 134 meq/L — ABNORMAL LOW (ref 137–147)

## 2013-06-06 LAB — CBC
HCT: 42.3 % (ref 39.0–52.0)
Hemoglobin: 14.9 g/dL (ref 13.0–17.0)
MCH: 31.6 pg (ref 26.0–34.0)
MCHC: 35.2 g/dL (ref 30.0–36.0)
MCV: 89.8 fL (ref 78.0–100.0)
Platelets: 212 K/uL (ref 150–400)
RBC: 4.71 MIL/uL (ref 4.22–5.81)
RDW: 15 % (ref 11.5–15.5)
WBC: 4.1 K/uL (ref 4.0–10.5)

## 2013-06-06 LAB — LEGIONELLA ANTIGEN, URINE: LEGIONELLA ANTIGEN, URINE: NEGATIVE

## 2013-06-06 LAB — GLUCOSE, CAPILLARY
GLUCOSE-CAPILLARY: 138 mg/dL — AB (ref 70–99)
GLUCOSE-CAPILLARY: 173 mg/dL — AB (ref 70–99)
GLUCOSE-CAPILLARY: 98 mg/dL (ref 70–99)
Glucose-Capillary: 131 mg/dL — ABNORMAL HIGH (ref 70–99)
Glucose-Capillary: 91 mg/dL (ref 70–99)

## 2013-06-06 LAB — PHOSPHORUS: Phosphorus: 6.3 mg/dL — ABNORMAL HIGH (ref 2.3–4.6)

## 2013-06-06 LAB — CARBOXYHEMOGLOBIN
Carboxyhemoglobin: 1.1 % (ref 0.5–1.5)
Methemoglobin: 0.8 % (ref 0.0–1.5)
O2 SAT: 69.9 %
Total hemoglobin: 15.1 g/dL (ref 13.5–18.0)

## 2013-06-06 LAB — MRSA PCR SCREENING: MRSA by PCR: POSITIVE — AB

## 2013-06-06 LAB — URINE MICROSCOPIC-ADD ON

## 2013-06-06 LAB — STREP PNEUMONIAE URINARY ANTIGEN: STREP PNEUMO URINARY ANTIGEN: NEGATIVE

## 2013-06-06 LAB — LACTIC ACID, PLASMA: Lactic Acid, Venous: 3 mmol/L — ABNORMAL HIGH (ref 0.5–2.2)

## 2013-06-06 LAB — MAGNESIUM: Magnesium: 2.2 mg/dL (ref 1.5–2.5)

## 2013-06-06 MED ORDER — BIOTENE DRY MOUTH MT LIQD
15.0000 mL | Freq: Two times a day (BID) | OROMUCOSAL | Status: DC
  Administered 2013-06-06 – 2013-06-08 (×6): 15 mL via OROMUCOSAL

## 2013-06-06 MED ORDER — HYDROCORTISONE NA SUCCINATE PF 100 MG IJ SOLR
50.0000 mg | Freq: Four times a day (QID) | INTRAMUSCULAR | Status: DC
Start: 2013-06-06 — End: 2013-06-09
  Administered 2013-06-06 – 2013-06-08 (×11): 50 mg via INTRAVENOUS
  Filled 2013-06-06 (×16): qty 1

## 2013-06-06 MED ORDER — PRO-STAT SUGAR FREE PO LIQD
30.0000 mL | Freq: Two times a day (BID) | ORAL | Status: DC
  Filled 2013-06-06 (×2): qty 30

## 2013-06-06 MED ORDER — VECURONIUM BROMIDE 10 MG IV SOLR
10.0000 mg | Freq: Once | INTRAVENOUS | Status: AC
  Administered 2013-06-06: 10 mg via INTRAVENOUS
  Filled 2013-06-06: qty 10

## 2013-06-06 MED ORDER — NOREPINEPHRINE BITARTRATE 1 MG/ML IJ SOLN
0.0000 ug/min | INTRAVENOUS | Status: DC
  Administered 2013-06-06 (×3): 50 ug/min via INTRAVENOUS
  Administered 2013-06-06: 60 ug/min via INTRAVENOUS
  Administered 2013-06-08: 50 ug/min via INTRAVENOUS
  Administered 2013-06-08: 12 ug/min via INTRAVENOUS
  Administered 2013-06-08: 50 ug/min via INTRAVENOUS
  Filled 2013-06-06 (×8): qty 16

## 2013-06-06 MED ORDER — FAMOTIDINE IN NACL 20-0.9 MG/50ML-% IV SOLN
20.0000 mg | INTRAVENOUS | Status: DC
  Administered 2013-06-06 – 2013-06-08 (×3): 20 mg via INTRAVENOUS
  Filled 2013-06-06 (×4): qty 50

## 2013-06-06 MED ORDER — CISATRACURIUM BOLUS VIA INFUSION
10.0000 mg | Freq: Once | INTRAVENOUS | Status: AC
  Administered 2013-06-06: 10 mg via INTRAVENOUS
  Filled 2013-06-06: qty 10

## 2013-06-06 MED ORDER — PHENYLEPHRINE HCL 10 MG/ML IJ SOLN
30.0000 ug/min | INTRAVENOUS | Status: DC
  Administered 2013-06-06: 80 ug/min via INTRAVENOUS
  Filled 2013-06-06 (×2): qty 1

## 2013-06-06 MED ORDER — SODIUM CHLORIDE 0.9 % IV SOLN
3.0000 ug/kg/min | INTRAVENOUS | Status: DC
  Administered 2013-06-06 – 2013-06-07 (×2): 3 ug/kg/min via INTRAVENOUS
  Administered 2013-06-08: 2 ug/kg/min via INTRAVENOUS
  Filled 2013-06-06 (×3): qty 20

## 2013-06-06 MED ORDER — STERILE WATER FOR INJECTION IV SOLN
INTRAVENOUS | Status: DC
  Administered 2013-06-06: 03:00:00 via INTRAVENOUS
  Filled 2013-06-06 (×3): qty 850

## 2013-06-06 MED ORDER — SODIUM BICARBONATE 8.4 % IV SOLN
INTRAVENOUS | Status: AC
  Filled 2013-06-06: qty 100

## 2013-06-06 MED ORDER — VITAL HIGH PROTEIN PO LIQD
1000.0000 mL | ORAL | Status: DC
  Filled 2013-06-06 (×2): qty 1000

## 2013-06-06 MED ORDER — ALBUTEROL SULFATE (2.5 MG/3ML) 0.083% IN NEBU: 2.5000 mg | INHALATION_SOLUTION | RESPIRATORY_TRACT | Status: DC | PRN

## 2013-06-06 MED ORDER — IPRATROPIUM BROMIDE 0.02 % IN SOLN
0.5000 mg | Freq: Four times a day (QID) | RESPIRATORY_TRACT | Status: DC
  Administered 2013-06-06 (×2): 0.5 mg via RESPIRATORY_TRACT
  Filled 2013-06-06 (×2): qty 2.5

## 2013-06-06 MED ORDER — SODIUM BICARBONATE 8.4 % IV SOLN
100.0000 meq | Freq: Once | INTRAVENOUS | Status: AC
  Administered 2013-06-06: 100 meq via INTRAVENOUS
  Filled 2013-06-06: qty 100

## 2013-06-06 MED ORDER — SODIUM BICARBONATE 8.4 % IV SOLN
100.0000 meq | Freq: Once | INTRAVENOUS | Status: AC
  Administered 2013-06-06: 100 meq via INTRAVENOUS

## 2013-06-06 MED ORDER — CHLORHEXIDINE GLUCONATE 0.12 % MT SOLN: 15.0000 mL | Freq: Two times a day (BID) | OROMUCOSAL | Status: DC

## 2013-06-06 MED ORDER — HEPARIN SODIUM (PORCINE) 5000 UNIT/ML IJ SOLN
5000.0000 [IU] | Freq: Three times a day (TID) | INTRAMUSCULAR | Status: DC
  Administered 2013-06-06 – 2013-06-08 (×10): 5000 [IU] via SUBCUTANEOUS
  Filled 2013-06-06 (×14): qty 1

## 2013-06-06 MED ORDER — VASOPRESSIN 20 UNIT/ML IJ SOLN
0.0300 [IU]/min | INTRAVENOUS | Status: DC
  Administered 2013-06-06: 0.03 [IU]/min via INTRAVENOUS
  Filled 2013-06-06 (×2): qty 2.5

## 2013-06-06 MED ORDER — CHLORHEXIDINE GLUCONATE 0.12 % MT SOLN
15.0000 mL | Freq: Two times a day (BID) | OROMUCOSAL | Status: DC
  Administered 2013-06-06 – 2013-06-08 (×6): 15 mL via OROMUCOSAL
  Filled 2013-06-06 (×4): qty 15

## 2013-06-06 MED ORDER — BUDESONIDE 0.25 MG/2ML IN SUSP
0.2500 mg | Freq: Four times a day (QID) | RESPIRATORY_TRACT | Status: DC
  Administered 2013-06-06 – 2013-06-08 (×11): 0.25 mg via RESPIRATORY_TRACT
  Filled 2013-06-06 (×17): qty 2

## 2013-06-06 MED ORDER — VANCOMYCIN HCL IN DEXTROSE 1-5 GM/200ML-% IV SOLN
1000.0000 mg | INTRAVENOUS | Status: DC
  Administered 2013-06-06: 1000 mg via INTRAVENOUS
  Filled 2013-06-06: qty 200

## 2013-06-06 MED ORDER — ARTIFICIAL TEARS OP OINT
1.0000 | TOPICAL_OINTMENT | Freq: Three times a day (TID) | OPHTHALMIC | Status: DC
Start: 2013-06-06 — End: 2013-06-09
  Administered 2013-06-06 – 2013-06-08 (×7): 1 via OPHTHALMIC
  Filled 2013-06-06 (×2): qty 3.5

## 2013-06-06 MED ORDER — SODIUM CHLORIDE 0.9 % IV BOLUS (SEPSIS)
1000.0000 mL | Freq: Once | INTRAVENOUS | Status: AC
  Administered 2013-06-06: 1000 mL via INTRAVENOUS

## 2013-06-06 MED ORDER — BUDESONIDE 0.25 MG/2ML IN SUSP
0.2500 mg | Freq: Four times a day (QID) | RESPIRATORY_TRACT | Status: DC
  Administered 2013-06-06: 0.25 mg via RESPIRATORY_TRACT
  Filled 2013-06-06 (×2): qty 2

## 2013-06-06 MED ORDER — INSULIN ASPART 100 UNIT/ML ~~LOC~~ SOLN
0.0000 [IU] | SUBCUTANEOUS | Status: DC
  Administered 2013-06-06: 3 [IU] via SUBCUTANEOUS

## 2013-06-06 MED ORDER — VITAL AF 1.2 CAL PO LIQD
1000.0000 mL | ORAL | Status: DC
  Administered 2013-06-06 – 2013-06-07 (×2): 1000 mL
  Filled 2013-06-06 (×4): qty 1000

## 2013-06-06 MED ORDER — ALBUTEROL SULFATE (2.5 MG/3ML) 0.083% IN NEBU
2.5000 mg | INHALATION_SOLUTION | Freq: Four times a day (QID) | RESPIRATORY_TRACT | Status: DC
  Administered 2013-06-06 (×2): 2.5 mg via RESPIRATORY_TRACT
  Filled 2013-06-06 (×2): qty 3

## 2013-06-06 MED ORDER — DEXTROSE 5 % IV SOLN
1.0000 g | Freq: Every day | INTRAVENOUS | Status: DC
  Administered 2013-06-06 – 2013-06-07 (×3): 1 g via INTRAVENOUS
  Filled 2013-06-06 (×4): qty 1

## 2013-06-06 MED ORDER — SODIUM CHLORIDE 0.9 % IV BOLUS (SEPSIS)
500.0000 mL | INTRAVENOUS | Status: DC | PRN
Start: 2013-06-06 — End: 2013-06-09
  Administered 2013-06-06: 500 mL via INTRAVENOUS

## 2013-06-06 MED ORDER — PANTOPRAZOLE SODIUM 40 MG IV SOLR
40.0000 mg | INTRAVENOUS | Status: DC
  Administered 2013-06-06: 40 mg via INTRAVENOUS
  Filled 2013-06-06 (×3): qty 40

## 2013-06-06 MED ORDER — LEVOFLOXACIN IN D5W 500 MG/100ML IV SOLN
500.0000 mg | INTRAVENOUS | Status: DC
  Administered 2013-06-06: 500 mg via INTRAVENOUS
  Filled 2013-06-06: qty 100

## 2013-06-06 MED ORDER — SODIUM CHLORIDE 0.45 % IV SOLN
INTRAVENOUS | Status: DC
  Administered 2013-06-06: 12:00:00 via INTRAVENOUS

## 2013-06-06 MED ORDER — IPRATROPIUM-ALBUTEROL 0.5-2.5 (3) MG/3ML IN SOLN
3.0000 mL | Freq: Four times a day (QID) | RESPIRATORY_TRACT | Status: DC
  Administered 2013-06-06 – 2013-06-08 (×10): 3 mL via RESPIRATORY_TRACT
  Filled 2013-06-06 (×11): qty 3

## 2013-06-06 MED ORDER — VANCOMYCIN HCL IN DEXTROSE 1-5 GM/200ML-% IV SOLN
1000.0000 mg | INTRAVENOUS | Status: DC
  Administered 2013-06-07: 1000 mg via INTRAVENOUS
  Filled 2013-06-06: qty 200

## 2013-06-06 MED ORDER — PHENYLEPHRINE HCL 10 MG/ML IJ SOLN
30.0000 ug/min | INTRAVENOUS | Status: DC
  Administered 2013-06-06: 200 ug/min via INTRAVENOUS
  Administered 2013-06-07: 180 ug/min via INTRAVENOUS
  Administered 2013-06-07: 200 ug/min via INTRAVENOUS
  Administered 2013-06-07: 80 ug/min via INTRAVENOUS
  Administered 2013-06-07: 200 ug/min via INTRAVENOUS
  Administered 2013-06-07: 150 ug/min via INTRAVENOUS
  Administered 2013-06-08: 200 ug/min via INTRAVENOUS
  Administered 2013-06-08: 75 ug/min via INTRAVENOUS
  Administered 2013-06-08: 200 ug/min via INTRAVENOUS
  Administered 2013-06-08: 150 ug/min via INTRAVENOUS
  Administered 2013-06-09: 300 ug/min via INTRAVENOUS
  Administered 2013-06-09: 200 ug/min via INTRAVENOUS
  Administered 2013-06-09: 300 ug/min via INTRAVENOUS
  Filled 2013-06-06 (×14): qty 4

## 2013-06-06 NOTE — Procedures (Signed)
Arterial Catheter Insertion Procedure Note ARTEMUS ROMANOFF [id number] 02-21-1946  Procedure: Insertion of Arterial Catheter  Indications: Blood pressure monitoring and Frequent blood sampling  Procedure Details Consent: Risks of procedure as well as the alternatives and risks of each were explained to the (patient/caregiver).  Consent for procedure obtained. Time Out: Verified patient identification, verified procedure, site/side was marked, verified correct patient position, special equipment/implants available, medications/allergies/relevent history reviewed, required imaging and test results available.  Performed  Maximum sterile technique was used including antiseptics, cap, gloves, gown, hand hygiene, mask and sheet. Skin prep: Chlorhexidine; local anesthetic administered 20 gauge catheter was inserted into right radial artery using the Seldinger technique.  Evaluation Blood flow good; BP tracing good. Complications: No apparent complications.   Jennet Maduro 06/06/2013

## 2013-06-06 NOTE — Progress Notes (Signed)
PULMONARY / CRITICAL CARE MEDICINE   Name: Troy Perez MRN: [id number] DOB: Aug 05, 1945    ADMISSION DATE:  05/18/2013 CONSULTATION DATE:  3/24  REFERRING MD :  Dr. Wyvonnia Dusky PRIMARY SERVICE: PCCM  CHIEF COMPLAINT:  Acute Respiratory Failure   BRIEF PATIENT DESCRIPTION: 68 y/o M with PMH of COPD presented 3/24 with L PNA, progressed to resp fx in [facility] ER, intubated & tx to [facility] for further care.   SIGNIFICANT EVENTS/STUDIES:    3/24 presented to [facility] ER with 48 h hx of increasing cough, SOB, chest tightness and low back pain.  Found to have L PNA. Progressed to resp fx requiring intubation.  3/25 BP stabilized on high dose vasopressors. Renal function improving. Able to F/C on WUA   LINES / TUBES: ETT 3/24 >>  R IJ CVL 3/25 >>  R radial A-line 3/25 >>   CULTURES: U. Strep 3/24 >> NEG Resp 3/24 >> Mod GPCs, rare GNRs >>  Blood 3/24>>   ANTIBIOTICS: Vanc 3/24 >>  Levoflox 3/24 >>  Cefepime 3/24 >>    SUBJECTIVE:  RASS -2. + F/C on WUA  VITAL SIGNS: Temp:  [97.4 F (36.3 C)-100.1 F (37.8 C)] 99.3 F (37.4 C) (03/25 1236) Pulse Rate:  [31-134] 119 (03/25 1215) Resp:  [18-35] 18 (03/25 1215) BP: (40-162)/(17-105) 100/55 mmHg (03/25 1200) SpO2:  [73 %-100 %] 96 % (03/25 1215) Arterial Line BP: (85-99)/(47-54) 85/54 mmHg (03/25 1215) FiO2 (%):  [40 %-100 %] 50 % (03/25 1200) Weight:  [72.576 kg (160 lb)-75.1 kg (165 lb 9.1 oz)] 75.1 kg (165 lb 9.1 oz) (03/25 0600)  HEMODYNAMICS: CVP:  [8 mmHg-10 mmHg] 8 mmHg  VENTILATOR SETTINGS: Vent Mode:  [-] PRVC FiO2 (%):  [40 %-100 %] 50 % Set Rate:  [14 bmp-24 bmp] 16 bmp Vt Set:  [500 mL-600 mL] 600 mL PEEP:  [5 cmH20-10 cmH20] 5 cmH20 Plateau Pressure:  [21 cmH20-30 cmH20] 28 cmH20  INTAKE / OUTPUT: Intake/Output     03/24 0701 - 03/25 0700 03/25 0701 - 03/26 0700   I.V. (mL/kg) 1241.3 (16.5) 456.9 (6.1)   IV Piggyback 1250    Total Intake(mL/kg) 2491.3 (33.2) 456.9 (6.1)   Urine (mL/kg/hr) 300 85 (0.2)   Total  Output 300 85   Net +2191.3 +371.9        Urine Occurrence 1 x      PHYSICAL EXAMINATION: General:  Intubated, sedated Neuro:  MAEs, no focal deficits HEENT: NCAT, EOMI. PERRL Cardiovascular:  RRR s M Lungs: diffuse coarse rhonchi, no wheezes noted Abdomen:  Soft, NT, NABS Ext: warm, no edema  LABS: I have reviewed all of today's lab results. 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 P:   Monitor BMET intermittently Monitor I/Os Correct electrolytes as indicated  GASTROINTESTINAL A:   No acute issues P:   SUP: IV famotidine Cont TFs per protocol   HEMATOLOGIC A:   No issues P:  DVT px: SQ heparin Monitor CBC intermittently  Transfuse per usual ICU guidelines  INFECTIOUS A:   Severe CAP  Severe sepsis H/O pseudomonas PNA P:   Micro and abx as above  ENDOCRINE A:   DM 2  P:   Cont CBGs/SSI q 4 hrs  NEUROLOGIC A:   Acute Encephalopathy, septic - improved P:   Cont Propofol and fent gtt Daily WUA as indicated   Family updated @ bedside  I have personally obtained a history, examined the patient, evaluated laboratory and imaging results, formulated the assessment and plan and placed orders.  CRITICAL CARE: The patient is critically ill with multiple organ systems failure and requires high complexity decision making for assessment and support, frequent evaluation and titration of therapies, application of advanced monitoring technologies and  extensive interpretation of multiple databases. Critical Care Time devoted to patient care services described in this note is 45 minutes.   Merton Border, MD ; [facility] [phone].  After 5:30 PM or weekends, call [phone]  06/06/2013, 2:20 PM

## 2013-06-06 NOTE — Progress Notes (Signed)
[facility] Progress Note Patient Name: Troy Perez DOB: 01-08-1946 MRN: [id number]  Date of Service  06/06/2013   HPI/Events of Note  Continued issues of vent synchrony, adequate sedation without dropping pressures and ongoing metabolic and resp acidosis.  Current pH of 7.18/52/77/19.  Sign air-trapping at higher RR.   eICU Interventions  Plan: Continue RR at 24 Increase TV to 600cc 2amps of bicarb IVP Increase Bicarb rate to 125 cc/hr One time dose of paralytic   Intervention Category Major Interventions: Acid-Base disturbance - evaluation and management;Respiratory failure - evaluation and management  DETERDING,ELIZABETH 06/06/2013, 4:04 AM

## 2013-06-06 NOTE — Progress Notes (Signed)
No SBT/wean done at this time due to pt just intubated less than 24 hours ago, pt is on pressors and is requiring high FiO2. Pt also continues to have copious amounts of secretions. RT will monitor.

## 2013-06-06 NOTE — Progress Notes (Signed)
Vent changes made per MD order. 

## 2013-06-06 NOTE — Progress Notes (Addendum)
INITIAL NUTRITION ASSESSMENT  DOCUMENTATION CODES Per approved criteria  -Not Applicable   INTERVENTION:  Utilize 78M PEPuP Protocol: initiate TF via OGT with Vital AF 1.2 at 25 ml/h on day 1; on day 2, increase to goal rate of 65 ml/h (1560 ml per day) to provide 1872 kcals, 117 gm protein, 1265 ml free water daily.  Above TF regimen plus current propofol will provide a total of 1964 kcals per day.  NUTRITION DIAGNOSIS: Inadequate oral intake related to inability to eat as evidenced by NPO status.   Goal: Intake to meet >90% of estimated nutrition needs.  Monitor:  TF tolerance/adequacy, weight trend, labs, vent status.  Reason for Assessment: MD Consult for TF initiation and management.  68 y.o. male  Admitting Dx: Acute respiratory failure  ASSESSMENT: Patient is a 68 y/o M with PMH of COPD presented 3/24 with L PNA, progressed to resp fx in [facility] ER, intubated & tx to [facility] for further care.   Patient's daughter reports that patient usually eats well at home. No nutrition issues PTA. Weight stable PTA. Nutrition focused physical exam completed.  No muscle or subcutaneous fat depletion noticed.  Patient is currently intubated on ventilator support.  MV: 13.3 L/min Temp (24hrs), Avg:98.7 F (37.1 C), Min:97.4 F (36.3 C), Max:100.1 F (37.8 C)  Propofol: 3.5 ml/hr providing 92 kcals/day.  Height: Ht Readings from Last 1 Encounters:  05/26/2013 5\' 7"  (1.702 m)    Weight: Wt Readings from Last 1 Encounters:  06/06/13 165 lb 9.1 oz (75.1 kg)    Ideal Body Weight: 67.3 kg  % Ideal Body Weight: 112%  Wt Readings from Last 10 Encounters:  06/06/13 165 lb 9.1 oz (75.1 kg)  02/21/13 162 lb 4.8 oz (73.619 kg)  08/22/12 158 lb 14.4 oz (72.077 kg)  05/17/12 160 lb (72.576 kg)  02/15/12 158 lb 3.2 oz (71.759 kg)  10/05/11 155 lb 5 oz (70.449 kg)  06/08/11 160 lb (72.576 kg)  05/12/11 160 lb (72.576 kg)  12/03/10 157 lb 8 oz (71.442 kg)  12/16/10 153 lb (69.4 kg)     Usual Body Weight: 162 lb  % Usual Body Weight: 102%  BMI:  Body mass index is 25.93 kg/(m^2).  Estimated Nutritional Needs: Kcal: 1933 Protein: 105-120 gm Fluid: 2 L  Skin: no wounds  Diet Order:  NPO  EDUCATION NEEDS: -Education not appropriate at this time   Intake/Output Summary (Last 24 hours) at 06/06/13 1101 Last data filed at 06/06/13 1000  Gross per 24 hour  Intake 2948.17 ml  Output    385 ml  Net 2563.17 ml    Last BM: PTA   Labs:   Recent Labs Lab 05/28/2013 1818 06/06/13 0400  NA 131* 134*  K 3.8 4.4  CL 92* 97  CO2 19 18*  BUN 43* 47*  CREATININE 2.55* 2.45*  CALCIUM 8.6 7.4*  GLUCOSE 182* 176*    CBG (last 3)   Recent Labs  06/03/2013 1811 06/06/13 06/06/13 0741  GLUCAP 140* 138* 173*    Scheduled Meds: . antiseptic oral rinse  15 mL Mouth Rinse q12n4p  . budesonide  0.25 mg Nebulization 4 times per day  . ceFEPime (MAXIPIME) IV  1 g Intravenous QHS  . chlorhexidine  15 mL Mouth Rinse BID  . famotidine (PEPCID) IV  20 mg Intravenous Q24H  . feeding supplement (PRO-STAT SUGAR FREE 64)  30 mL Per Tube BID  . feeding supplement (VITAL HIGH PROTEIN)  1,000 mL Per Tube Q24H  .  heparin subcutaneous  5,000 Units Subcutaneous 3 times per day  . hydrocortisone sod succinate (SOLU-CORTEF) inj  50 mg Intravenous Q6H  . insulin aspart  0-15 Units Subcutaneous 6 times per day  . ipratropium-albuterol  3 mL Nebulization Q6H  . levofloxacin (LEVAQUIN) IV  500 mg Intravenous Q48H  . [START ON 06/07/2013] vancomycin  1,000 mg Intravenous Q24H    Continuous Infusions: . sodium chloride    . fentaNYL infusion INTRAVENOUS 100 mcg/hr (06/06/13 0551)  . norepinephrine (LEVOPHED) Adult infusion 50 mcg/min (06/06/13 1000)  . propofol 8 mcg/kg/min (06/06/13 1000)  . vasopressin (PITRESSIN) infusion - *FOR SHOCK* 0.03 Units/min (06/06/13 0352)    Past Medical History  Diagnosis Date  . Malignant neoplasm of bladder, part unspecified   . Diabetes  mellitus     History reviewed. No pertinent past surgical history.   Molli Barrows, RD, LDN, [place] Pager [phone] After Hours Pager [phone]

## 2013-06-06 NOTE — Progress Notes (Signed)
Discussed ABG results with Dr. Jimmy Footman.  Adjusted set rate to 30 (to match patients set rate) and pushed amp of bicarb.  Patients rate reduced after bicarb push.  Levophed gtt had been weaned off.  Propofol at 40 mcg & fentanyl at 200 mcg infusing.  After rate change, auto peep increased and then noted drop in blood pressure.  Propofol gtt stopped and fentanyl reduced to 50 mcg.  Adjusted rate to 24 and PEEP to 5.  NS bolus of 1L infused & restarted levophed.  Pt's blood pressure improved.     Plan: -repeat lactic acid, svo2 -assess ABG @ 345 am   Noe Gens, NP-C [place] [facility] Pgr: [phone] or [phone]

## 2013-06-06 NOTE — Progress Notes (Signed)
eLink Physician-Brief Progress Note Patient Name: Troy Perez DOB: [birth year] MRN: [id number]  Date of Service  06/06/2013   HPI/Events of Note  Agitated on vent and in shock  eICU Interventions  Add neo drip Paralyze with NMB   Intervention Category Major Interventions: Acid-Base disturbance - evaluation and management Intermediate Interventions: Respiratory distress - evaluation and management  Asencion Noble 06/06/2013, 7:15 PM

## 2013-06-06 NOTE — Progress Notes (Addendum)
ANTIBIOTIC CONSULT NOTE - INITIAL  Pharmacy Consult for Vancomycin/Cefepime/Levaquin Indication: rule out pneumonia  No Known Allergies  Patient Measurements: Height: 5\' 7"  (170.2 cm) Weight: 161 lb 13.1 oz (73.4 kg) IBW/kg (Calculated) : 66.1  Vital Signs: Temp: 99.3 F (37.4 C) (03/25 0000) Temp src: Oral (03/25 0000) BP: 80/58 mmHg (03/24 2229) Pulse Rate: 113 (03/24 2229) Intake/Output from previous day:   Intake/Output from this shift:    Labs:  Recent Labs  05/28/2013 1818  WBC 7.1  HGB 16.2  PLT 240  CREATININE 2.55*   Estimated Creatinine Clearance: 25.6 ml/min (by C-G formula based on Cr of 2.55). No results found for this basename: VANCOTROUGH, VANCOPEAK, VANCORANDOM, GENTTROUGH, GENTPEAK, GENTRANDOM, TOBRATROUGH, TOBRAPEAK, TOBRARND, AMIKACINPEAK, AMIKACINTROU, AMIKACIN,  in the last 72 hours   Microbiology: No results found for this or any previous visit (from the past 720 hour(s)).  Medical History: Past Medical History  Diagnosis Date  . Malignant neoplasm of bladder, part unspecified   . Diabetes mellitus     Medications:  Prescriptions prior to admission  Medication Sig Dispense Refill  . acetaminophen (TYLENOL) 500 MG tablet Take 1,000 mg by mouth once as needed.      Marland Kitchen azithromycin (ZITHROMAX) 250 MG tablet Take 250-500 mg by mouth See admin instructions. Two tablet tablets (500mg  total) by mouth on day 1, then take one tablet daily (250mg  total) every day for 4 days thereafter      . benzonatate (TESSALON) 100 MG capsule Take 100 mg by mouth 3 (three) times daily as needed. For cough      . Fluticasone-Salmeterol (ADVAIR) 250-50 MCG/DOSE AEPB Inhale 1 puff into the lungs 2 (two) times daily.      . predniSONE (DELTASONE) 20 MG tablet Take 20-40 mg by mouth daily. 9 day supply starting on 06/04/2013      . vitamin C (ASCORBIC ACID) 500 MG tablet Take 500 mg by mouth daily.       Assessment: 68 yo Perez with PNA for empiric antibiotics.   Vancomycin 1 g IV given in ED at 1800  Goal of Therapy:  Vancomycin trough level 15-20 mcg/ml  Plan:  Vancomycin 1  g IV q48h, next dose at 0600 Levaquin 500 mg IV q48h Cefepime 1 g IV q24h  Abbott, Bronson Curb 06/06/2013,12:30 AM   ====================================   Addendum: - renal fxn improving and anticipate continual improvement   Plan: - Change vanc to 1gm IV Q24H, start tomorrow as patient already received a dose this AM - Continue cefepime and levofloxacin as ordered - F/U AM labs    Makilah Dowda D. Mina Marble, PharmD, BCPS Pager:  [phone] 06/06/2013, 8:17 AM

## 2013-06-06 NOTE — Procedures (Signed)
PROCEDURE NOTE: R IJ CVL PLACEMENT  INDICATION:    Monitoring of central venous pressures and/or administration of medications optimally administered in central vein  CONSENT:   Risks of procedure as well as the alternatives were explained to the patient or surrogate. Consent for procedure obtained. A time out was performed to review patient identification, procedure to be performed, correct patient position, medications/allergies/relevent history, required imaging and test results.  PROCEDURE  Maximum sterile technique was used including antiseptics, cap, gloves, gown, hand hygiene, mask and sheet.  Skin prep: Chlorhexidine; local anesthetic administered  A antimicrobial bonded/coated triple lumen catheter was placed in the R IJ vein using the Seldinger technique.  Ultrasound was used for vessel identification and guidance.   EVALUATION:  Blood flow good  Complications: No apparent complications  Patient tolerated the procedure well.  Chest X-ray ordered to verify placement and is pending  Procedure performed by ACNP Alfredo Martinez under my direct supervision  Merton Border, MD PCCM service Mobile [phone]

## 2013-06-07 ENCOUNTER — Inpatient Hospital Stay (HOSPITAL_COMMUNITY): Payer: Medicare HMO

## 2013-06-07 ENCOUNTER — Encounter (HOSPITAL_COMMUNITY): Payer: Self-pay | Admitting: Pulmonary Disease

## 2013-06-07 LAB — BASIC METABOLIC PANEL
BUN: 61 mg/dL — ABNORMAL HIGH (ref 6–23)
BUN: 64 mg/dL — ABNORMAL HIGH (ref 6–23)
CHLORIDE: 91 meq/L — AB (ref 96–112)
CO2: 20 mEq/L (ref 19–32)
CO2: 18 meq/L — AB (ref 19–32)
CREATININE: 3.05 mg/dL — AB (ref 0.50–1.35)
Calcium: 6.7 mg/dL — ABNORMAL LOW (ref 8.4–10.5)
Calcium: 6.6 mg/dL — ABNORMAL LOW (ref 8.4–10.5)
Chloride: 92 mEq/L — ABNORMAL LOW (ref 96–112)
Creatinine, Ser: 3.4 mg/dL — ABNORMAL HIGH (ref 0.50–1.35)
GFR calc Af Amer: 20 mL/min — ABNORMAL LOW (ref >90)
GFR calc non Af Amer: 19 mL/min — ABNORMAL LOW (ref >90)
GFR calc non Af Amer: 17 mL/min — ABNORMAL LOW (ref >90)
GFR, EST AFRICAN AMERICAN: 22 mL/min — AB (ref >90)
Glucose, Bld: 160 mg/dL — ABNORMAL HIGH (ref 70–99)
Glucose, Bld: 259 mg/dL — ABNORMAL HIGH (ref 70–99)
POTASSIUM: 5.8 meq/L — AB (ref 3.7–5.3)
Potassium: 5.3 mEq/L (ref 3.7–5.3)
SODIUM: 127 meq/L — AB (ref 137–147)
Sodium: 129 mEq/L — ABNORMAL LOW (ref 137–147)

## 2013-06-07 LAB — GLUCOSE, CAPILLARY
GLUCOSE-CAPILLARY: 90 mg/dL (ref 70–99)
GLUCOSE-CAPILLARY: 115 mg/dL — AB (ref 70–99)
GLUCOSE-CAPILLARY: 84 mg/dL (ref 70–99)
GLUCOSE-CAPILLARY: 139 mg/dL — AB (ref 70–99)
Glucose-Capillary: 105 mg/dL — ABNORMAL HIGH (ref 70–99)
Glucose-Capillary: 165 mg/dL — ABNORMAL HIGH (ref 70–99)
Glucose-Capillary: 45 mg/dL — ABNORMAL LOW (ref 70–99)

## 2013-06-07 LAB — CBC
HCT: 43.1 % (ref 39.0–52.0)
Hemoglobin: 15.1 g/dL (ref 13.0–17.0)
MCH: 31.7 pg (ref 26.0–34.0)
MCHC: 35 g/dL (ref 30.0–36.0)
MCV: 90.5 fL (ref 78.0–100.0)
Platelets: 176 10*3/uL (ref 150–400)
RBC: 4.76 MIL/uL (ref 4.22–5.81)
RDW: 14.8 % (ref 11.5–15.5)
WBC: 6.2 10*3/uL (ref 4.0–10.5)

## 2013-06-07 LAB — POCT I-STAT 3, ART BLOOD GAS (G3+)
Acid-base deficit: 11 mmol/L — ABNORMAL HIGH (ref 0.0–2.0)
BICARBONATE: 21.2 meq/L (ref 20.0–24.0)
O2 Saturation: 94 %
PH ART: 7.072 — AB (ref 7.350–7.450)
Patient temperature: 97.8
TCO2: 23 mmol/L (ref 0–100)
pCO2 arterial: 72.3 mmHg (ref 35.0–45.0)
pO2, Arterial: 99 mmHg (ref 80.0–100.0)

## 2013-06-07 LAB — PHOSPHORUS
Phosphorus: 6.6 mg/dL — ABNORMAL HIGH (ref 2.3–4.6)
Phosphorus: 6.7 mg/dL — ABNORMAL HIGH (ref 2.3–4.6)

## 2013-06-07 LAB — MAGNESIUM
Magnesium: 2.1 mg/dL (ref 1.5–2.5)
Magnesium: 2.1 mg/dL (ref 1.5–2.5)

## 2013-06-07 MED ORDER — MIDAZOLAM BOLUS VIA INFUSION
1.0000 mg | INTRAVENOUS | Status: DC | PRN
  Filled 2013-06-07: qty 2

## 2013-06-07 MED ORDER — SODIUM POLYSTYRENE SULFONATE 15 GM/60ML PO SUSP
30.0000 g | Freq: Once | ORAL | Status: AC
  Administered 2013-06-07: 30 g
  Filled 2013-06-07: qty 120

## 2013-06-07 MED ORDER — LINEZOLID 2 MG/ML IV SOLN
600.0000 mg | Freq: Two times a day (BID) | INTRAVENOUS | Status: DC
  Administered 2013-06-07 – 2013-06-08 (×3): 600 mg via INTRAVENOUS
  Filled 2013-06-07 (×5): qty 300

## 2013-06-07 MED ORDER — SODIUM CHLORIDE 0.9 % IV SOLN
1.0000 mg/h | INTRAVENOUS | Status: DC
  Filled 2013-06-07: qty 10

## 2013-06-07 MED ORDER — DEXTROSE 50 % IV SOLN
INTRAVENOUS | Status: AC
  Administered 2013-06-07: 50 mL via INTRAVENOUS
  Filled 2013-06-07: qty 50

## 2013-06-07 MED ORDER — SODIUM CHLORIDE 0.9 % IV SOLN
0.0000 mg/h | INTRAVENOUS | Status: DC
  Administered 2013-06-07: 2 mg/h via INTRAVENOUS
  Filled 2013-06-07: qty 10

## 2013-06-07 MED ORDER — ACETAMINOPHEN 160 MG/5ML PO SOLN
650.0000 mg | ORAL | Status: DC | PRN
  Administered 2013-06-08: 650 mg
  Filled 2013-06-07 (×2): qty 20.3

## 2013-06-07 MED ORDER — DEXTROSE-NACL 5-0.9 % IV SOLN
INTRAVENOUS | Status: DC
  Administered 2013-06-07 – 2013-06-08 (×2): via INTRAVENOUS

## 2013-06-07 MED ORDER — INSULIN ASPART 100 UNIT/ML ~~LOC~~ SOLN
0.0000 [IU] | SUBCUTANEOUS | Status: DC
  Administered 2013-06-07 – 2013-06-08 (×4): 1 [IU] via SUBCUTANEOUS
  Administered 2013-06-08: 2 [IU] via SUBCUTANEOUS
  Administered 2013-06-08: 1 [IU] via SUBCUTANEOUS

## 2013-06-07 MED ORDER — DEXTROSE 50 % IV SOLN
50.0000 mL | Freq: Once | INTRAVENOUS | Status: AC | PRN
  Administered 2013-06-07: 50 mL via INTRAVENOUS

## 2013-06-07 MED ORDER — MUPIROCIN 2 % EX OINT
TOPICAL_OINTMENT | Freq: Two times a day (BID) | CUTANEOUS | Status: DC
  Administered 2013-06-07 – 2013-06-08 (×4): via NASAL
  Filled 2013-06-07: qty 22

## 2013-06-07 MED ORDER — VITAL AF 1.2 CAL PO LIQD
1000.0000 mL | ORAL | Status: DC
  Filled 2013-06-07 (×2): qty 1000

## 2013-06-07 MED ORDER — CHLORHEXIDINE GLUCONATE CLOTH 2 % EX PADS
6.0000 | MEDICATED_PAD | Freq: Every day | CUTANEOUS | Status: DC
  Administered 2013-06-08 – 2013-06-09 (×2): 6 via TOPICAL

## 2013-06-07 MED ORDER — SODIUM POLYSTYRENE SULFONATE 15 GM/60ML PO SUSP
60.0000 g | Freq: Once | ORAL | Status: AC
  Administered 2013-06-07: 60 g
  Filled 2013-06-07: qty 240

## 2013-06-07 NOTE — Progress Notes (Signed)
NUTRITION FOLLOW UP  Intervention:    Change to trophic feedings of Vital AF 1.2 at 20 ml/h for now.  When able to advance feedings, utilize 22M PEPuP Protocol: initiate TF via OGT with Vital AF 1.2 at 25 ml/h on day 1; on day 2, increase to goal rate of 65 ml/h (1560 ml per day) to provide 1872 kcals, 117 gm protein, 1265 ml free water daily.  Nutrition Dx:   Inadequate oral intake related to inability to eat as evidenced by NPO status.   Goal:   Intake to meet >90% of estimated nutrition needs. Unmet with trophic feedings.  Monitor:   TF tolerance/adequacy, weight trend, labs, vent status.  Assessment:   Patient is a 68 y/o M with PMH of COPD presented 3/24 with L PNA, progressed to resp fx in [facility] ER, intubated & tx to [facility] for further care.   Per discussion with physician and RN in rounds, patient now on paralytics. Will change tube feedings to trophic rate of 20 ml/h. Hopeful to advance tube feedings tomorrow.  Patient is currently intubated on ventilator support.  MV: 10 L/min Temp (24hrs), Avg:99.7 F (37.6 C), Min:98.9 F (37.2 C), Max:100.9 F (38.3 C)  Propofol: off  Height: Ht Readings from Last 1 Encounters:  05/31/2013 5\' 7"  (1.702 m)    Weight Status:   Wt Readings from Last 1 Encounters:  06/06/13 165 lb 9.1 oz (75.1 kg)    Re-estimated needs:  Kcal: 1914 Protein: 105-120 gm Fluid: 2 L  Skin: no wounds  Diet Order:  NPO   Intake/Output Summary (Last 24 hours) at 06/07/13 1107 Last data filed at 06/07/13 1000  Gross per 24 hour  Intake 4945.08 ml  Output    690 ml  Net 4255.08 ml    Last BM: PTA   Labs:   Recent Labs Lab 05/14/2013 1818 06/06/13 0400 06/06/13 2244 06/07/13 0450 06/07/13 0500  NA 131* 134*  --  129*  --   K 3.8 4.4  --  5.8*  --   CL 92* 97  --  92*  --   CO2 19 18*  --  20  --   BUN 43* 47*  --  61*  --   CREATININE 2.55* 2.45*  --  3.05*  --   CALCIUM 8.6 7.4*  --  6.7*  --   MG  --  2.2 2.1  --  2.1  PHOS  --   6.3* 6.6*  --  6.7*  GLUCOSE 182* 176*  --  160*  --     CBG (last 3)   Recent Labs  06/07/13 0017 06/07/13 0345 06/07/13 0756  GLUCAP 105* 90 115*    Scheduled Meds: . antiseptic oral rinse  15 mL Mouth Rinse q12n4p  . artificial tears  1 application Both Eyes 3 times per day  . budesonide  0.25 mg Nebulization 4 times per day  . ceFEPime (MAXIPIME) IV  1 g Intravenous QHS  . chlorhexidine  15 mL Mouth Rinse BID  . famotidine (PEPCID) IV  20 mg Intravenous Q24H  . heparin subcutaneous  5,000 Units Subcutaneous 3 times per day  . hydrocortisone sod succinate (SOLU-CORTEF) inj  50 mg Intravenous Q6H  . insulin aspart  0-9 Units Subcutaneous 6 times per day  . ipratropium-albuterol  3 mL Nebulization Q6H  . linezolid  600 mg Intravenous Q12H    Continuous Infusions: . sodium chloride 50 mL/hr at 06/07/13 1000  . cisatracurium (NIMBEX) infusion 3  mcg/kg/min (06/07/13 1040)  . feeding supplement (VITAL AF 1.2 CAL) 1,000 mL (06/07/13 1000)  . fentaNYL infusion INTRAVENOUS 75 mcg/hr (06/07/13 1040)  . norepinephrine (LEVOPHED) Adult infusion 5 mcg/min (06/07/13 1040)  . phenylephrine (NEO-SYNEPHRINE) Adult infusion 200 mcg/min (06/07/13 1000)  . propofol Stopped (06/06/13 2301)  . vasopressin (PITRESSIN) infusion - *FOR SHOCK* 0.03 Units/min (06/07/13 1000)     Molli Barrows, RD, LDN, [place] Pager [phone] After Hours Pager [phone]

## 2013-06-07 NOTE — Progress Notes (Signed)
PULMONARY / CRITICAL CARE MEDICINE   Name: Troy Perez MRN: [id number] DOB: Jan 27, 1946    ADMISSION DATE:  07/04/13 CONSULTATION DATE:  3/24  REFERRING MD :  Dr. Wyvonnia Dusky PRIMARY SERVICE: PCCM  CHIEF COMPLAINT:  Acute Respiratory Failure   BRIEF PATIENT DESCRIPTION: 68 y/o M with PMH of COPD presented 3/24 with L PNA, progressed to resp fx in [facility] ER, intubated & tx to [facility] for further care.   SIGNIFICANT EVENTS/STUDIES:    3/24 presented to [facility] ER with 48 h hx of increasing cough, SOB, chest tightness and low back pain.  Found to have L PNA. Progressed to resp fx requiring intubation.  3/25 BP stabilized on high dose vasopressors/stress dose steroids. Uo improving. Able to F/C on WUA 3/25 PM: phenylephrine added for persistent shock. Severe ventilator dyssynchrony requiring NMBs.  3/26 Weaning norepinephrine. Remains on PE, VP and stress dose steroids. Copious bloody ET secretions. 2/2 blood cultures positive for staph aureus. Resp culture + for staph aureus. K+ 5/8 > kayexalate given  LINES / TUBES: ETT 3/24 >>  R IJ CVL 3/25 >>  R radial A-line 3/25 >>   CULTURES: MRSA PCR 3/24 >> POS U. Strep 3/24 >> NEG Resp 3/24 >> Mod staph aureus Blood 3/24>> 2/2 staph aureus  ANTIBIOTICS: Vanc 3/24 >> 3/26 Levoflox 3/24 >> 3/26 Cefepime 3/24 >>  Linezolid 3/26 >>   SUBJECTIVE:  Sedated, paralyzed  VITAL SIGNS: Temp:  [98.9 F (37.2 C)-102.9 F (39.4 C)] 102.9 F (39.4 C) (03/26 1200) Pulse Rate:  [120-133] 121 (03/26 1500) Resp:  [14-33] 20 (03/26 1500) BP: (82-129)/(40-77) 129/41 mmHg (03/26 1430) SpO2:  [90 %-99 %] 96 % (03/26 1500) Arterial Line BP: (70-118)/(43-60) 108/49 mmHg (03/26 1500) FiO2 (%):  [40 %-50 %] 40 % (03/26 1239)  HEMODYNAMICS: CVP:  [9 mmHg-10 mmHg] 10 mmHg  VENTILATOR SETTINGS: Vent Mode:  [-] PRVC FiO2 (%):  [40 %-50 %] 40 % Set Rate:  [16 bmp-20 bmp] 20 bmp Vt Set:  [500 mL-600 mL] 500 mL PEEP:  [5 cmH20] 5 cmH20 Plateau Pressure:  [20  cmH20-27 cmH20] 25 cmH20  INTAKE / OUTPUT: Intake/Output     03/25 0701 - 03/26 0700 03/26 0701 - 03/27 0700   I.V. (mL/kg) 3901.1 (51.9) 1194.7 (15.9)   NG/GT 490 340   IV Piggyback 400    Total Intake(mL/kg) 4791.1 (63.8) 1534.7 (20.4)   Urine (mL/kg/hr) 655 (0.4) 245 (0.4)   Total Output 655 245   Net +4136.1 +1289.7          PHYSICAL EXAMINATION: General:  Intubated, sedated, paralyzed Neuro:  Sedated, paralyzed HEENT: NCAT, PERRL Cardiovascular:  RRR s M Lungs: diffuse L>R coarse rhonchi, no wheezes noted Abdomen:  Soft, NT, NABS Ext: warm, no edema  LABS: I have reviewed all of today's lab results. Relevant abnormalities are discussed in the A/P section  CXR:  Worse L sided AS dz. 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 Hyperkalemia P:   Monitor BMET  intermittently Monitor I/Os Correct electrolytes as indicated  GASTROINTESTINAL A:   No acute issues P:   SUP: IV famotidine Cont TFs at trickle while on NMBs  HEMATOLOGIC A:   No issues P:  DVT px: SQ heparin Monitor CBC intermittently Transfuse per usual ICU guidelines  INFECTIOUS A:   Severe necrotizing staph PNA  Severe sepsis H/O pseudomonas PNA P:   Micro and abx as above  ENDOCRINE A:   DM 2, controlled Episodic hypoglycemia P:   Cont CBGs/SSI q 4 hrs - changed to sens scale 3/25  NEUROLOGIC A:   Acute Encephalopathy, septic Vent dyssynchrony P:   Cont Propofol and fent gtt - BIS goal  40-60 Cont NMBs for now Daily WUA as indicated   Family updated @ bedside  I have personally obtained a history, examined the patient, evaluated laboratory and imaging results, formulated the assessment and plan and placed orders.  CRITICAL CARE: The patient is critically ill with multiple organ systems failure and requires high complexity decision making for assessment and support, frequent evaluation and titration of therapies, application of advanced monitoring technologies and extensive interpretation of multiple databases. Critical Care Time devoted to patient care services described in this note is 55 minutes.   Merton Border, MD ; [facility] [phone].  After 5:30 PM or weekends, call [phone]  06/07/2013, 3:31 PM

## 2013-06-07 NOTE — Progress Notes (Signed)
Discussed with Dr. Alva Garnet patient febrile 102.5, and cbg 45, administered d50 ampule as per hypoglycemia protocol.  Tylenol ordered and administered as well as ice packs applied under axilla.  Will continue to monitor closely.

## 2013-06-08 ENCOUNTER — Inpatient Hospital Stay (HOSPITAL_COMMUNITY): Payer: Medicare HMO

## 2013-06-08 DIAGNOSIS — E872 Acidosis, unspecified: Secondary | ICD-10-CM

## 2013-06-08 DIAGNOSIS — E875 Hyperkalemia: Secondary | ICD-10-CM

## 2013-06-08 DIAGNOSIS — J15212 Pneumonia due to Methicillin resistant Staphylococcus aureus: Secondary | ICD-10-CM

## 2013-06-08 DIAGNOSIS — A4101 Sepsis due to Methicillin susceptible Staphylococcus aureus: Secondary | ICD-10-CM

## 2013-06-08 DIAGNOSIS — D696 Thrombocytopenia, unspecified: Secondary | ICD-10-CM

## 2013-06-08 DIAGNOSIS — J984 Other disorders of lung: Secondary | ICD-10-CM

## 2013-06-08 DIAGNOSIS — N179 Acute kidney failure, unspecified: Secondary | ICD-10-CM

## 2013-06-08 LAB — BASIC METABOLIC PANEL
BUN: 74 mg/dL — ABNORMAL HIGH (ref 6–23)
BUN: 74 mg/dL — ABNORMAL HIGH (ref 6–23)
BUN: 74 mg/dL — AB (ref 6–23)
CHLORIDE: 89 meq/L — AB (ref 96–112)
CHLORIDE: 90 meq/L — AB (ref 96–112)
CO2: 15 meq/L — AB (ref 19–32)
CO2: 16 meq/L — AB (ref 19–32)
CO2: 16 meq/L — AB (ref 19–32)
Calcium: 6.6 mg/dL — ABNORMAL LOW (ref 8.4–10.5)
Calcium: 6.7 mg/dL — ABNORMAL LOW (ref 8.4–10.5)
Calcium: 6.7 mg/dL — ABNORMAL LOW (ref 8.4–10.5)
Chloride: 89 mEq/L — ABNORMAL LOW (ref 96–112)
Creatinine, Ser: 4.36 mg/dL — ABNORMAL HIGH (ref 0.50–1.35)
Creatinine, Ser: 4.38 mg/dL — ABNORMAL HIGH (ref 0.50–1.35)
Creatinine, Ser: 4.53 mg/dL — ABNORMAL HIGH (ref 0.50–1.35)
GFR calc Af Amer: 15 mL/min — ABNORMAL LOW (ref >90)
GFR calc Af Amer: 15 mL/min — ABNORMAL LOW (ref >90)
GFR calc Af Amer: 14 mL/min — ABNORMAL LOW (ref >90)
GFR calc non Af Amer: 13 mL/min — ABNORMAL LOW (ref >90)
GFR calc non Af Amer: 13 mL/min — ABNORMAL LOW (ref >90)
GFR calc non Af Amer: 12 mL/min — ABNORMAL LOW (ref >90)
GLUCOSE: 153 mg/dL — AB (ref 70–99)
GLUCOSE: 136 mg/dL — AB (ref 70–99)
Glucose, Bld: 127 mg/dL — ABNORMAL HIGH (ref 70–99)
POTASSIUM: 6.3 meq/L — AB (ref 3.7–5.3)
Potassium: 7.5 mEq/L (ref 3.7–5.3)
Potassium: 6.2 mEq/L — ABNORMAL HIGH (ref 3.7–5.3)
SODIUM: 127 meq/L — AB (ref 137–147)
Sodium: 126 mEq/L — ABNORMAL LOW (ref 137–147)
Sodium: 127 mEq/L — ABNORMAL LOW (ref 137–147)

## 2013-06-08 LAB — GLUCOSE, CAPILLARY
GLUCOSE-CAPILLARY: 28 mg/dL — AB (ref 70–99)
Glucose-Capillary: 125 mg/dL — ABNORMAL HIGH (ref 70–99)
Glucose-Capillary: 134 mg/dL — ABNORMAL HIGH (ref 70–99)
Glucose-Capillary: 122 mg/dL — ABNORMAL HIGH (ref 70–99)
Glucose-Capillary: 167 mg/dL — ABNORMAL HIGH (ref 70–99)
Glucose-Capillary: 118 mg/dL — ABNORMAL HIGH (ref 70–99)
Glucose-Capillary: 126 mg/dL — ABNORMAL HIGH (ref 70–99)
Glucose-Capillary: <10 mg/dL — CL (ref 70–99)

## 2013-06-08 LAB — POCT I-STAT 3, ART BLOOD GAS (G3+)
ACID-BASE DEFICIT: 13 mmol/L — AB (ref 0.0–2.0)
Acid-base deficit: 11 mmol/L — ABNORMAL HIGH (ref 0.0–2.0)
BICARBONATE: 17.9 meq/L — AB (ref 20.0–24.0)
Bicarbonate: 20.1 mEq/L (ref 20.0–24.0)
O2 SAT: 87 %
O2 Saturation: 85 %
PH ART: 7.1 — AB (ref 7.350–7.450)
TCO2: 22 mmol/L (ref 0–100)
TCO2: 20 mmol/L (ref 0–100)
pCO2 arterial: 64.5 mmHg (ref 35.0–45.0)
pCO2 arterial: 65.5 mmHg (ref 35.0–45.0)
pH, Arterial: 7.055 — CL (ref 7.350–7.450)
pO2, Arterial: 67 mmHg — ABNORMAL LOW (ref 80.0–100.0)
pO2, Arterial: 82 mmHg (ref 80.0–100.0)

## 2013-06-08 LAB — CBC
HCT: 37.9 % — ABNORMAL LOW (ref 39.0–52.0)
HEMOGLOBIN: 13 g/dL (ref 13.0–17.0)
MCH: 31.3 pg (ref 26.0–34.0)
MCHC: 34.3 g/dL (ref 30.0–36.0)
MCV: 91.1 fL (ref 78.0–100.0)
Platelets: 61 10*3/uL — ABNORMAL LOW (ref 150–400)
RBC: 4.16 MIL/uL — AB (ref 4.22–5.81)
RDW: 15 % (ref 11.5–15.5)
WBC: 27.8 10*3/uL — ABNORMAL HIGH (ref 4.0–10.5)

## 2013-06-08 LAB — RENAL FUNCTION PANEL
Albumin: 1.4 g/dL — ABNORMAL LOW (ref 3.5–5.2)
Albumin: 2.1 g/dL — ABNORMAL LOW (ref 3.5–5.2)
BUN: 70 mg/dL — ABNORMAL HIGH (ref 6–23)
BUN: 61 mg/dL — ABNORMAL HIGH (ref 6–23)
CO2: 13 meq/L — AB (ref 19–32)
CO2: 16 meq/L — ABNORMAL LOW (ref 19–32)
Calcium: 6.5 mg/dL — ABNORMAL LOW (ref 8.4–10.5)
Calcium: 6.3 mg/dL — CL (ref 8.4–10.5)
Chloride: 88 mEq/L — ABNORMAL LOW (ref 96–112)
Chloride: 84 meq/L — ABNORMAL LOW (ref 96–112)
Creatinine, Ser: 4.28 mg/dL — ABNORMAL HIGH (ref 0.50–1.35)
Creatinine, Ser: 3.79 mg/dL — ABNORMAL HIGH (ref 0.50–1.35)
GFR calc Af Amer: 17 mL/min — ABNORMAL LOW
GFR calc non Af Amer: 15 mL/min — ABNORMAL LOW
GFR, EST AFRICAN AMERICAN: 15 mL/min — AB (ref >90)
GFR, EST NON AFRICAN AMERICAN: 13 mL/min — AB (ref >90)
Glucose, Bld: 122 mg/dL — ABNORMAL HIGH (ref 70–99)
Glucose, Bld: 130 mg/dL — ABNORMAL HIGH (ref 70–99)
PHOSPHORUS: 9.9 mg/dL — AB (ref 2.3–4.6)
Phosphorus: 9.8 mg/dL — ABNORMAL HIGH (ref 2.3–4.6)
Potassium: 6.4 mEq/L — ABNORMAL HIGH (ref 3.7–5.3)
Potassium: 5.3 meq/L (ref 3.7–5.3)
SODIUM: 127 meq/L — AB (ref 137–147)
Sodium: 129 meq/L — ABNORMAL LOW (ref 137–147)

## 2013-06-08 LAB — CULTURE, RESPIRATORY W GRAM STAIN: Special Requests: NORMAL

## 2013-06-08 LAB — CULTURE, RESPIRATORY

## 2013-06-08 MED ORDER — SODIUM POLYSTYRENE SULFONATE 15 GM/60ML PO SUSP
60.0000 g | Freq: Once | ORAL | Status: AC
  Administered 2013-06-08: 60 g via ORAL
  Filled 2013-06-08 (×2): qty 240

## 2013-06-08 MED ORDER — SODIUM CHLORIDE 0.9 % IV SOLN
600.0000 mg | INTRAVENOUS | Status: DC
  Administered 2013-06-08: 600 mg via INTRAVENOUS
  Filled 2013-06-08 (×2): qty 600

## 2013-06-08 MED ORDER — PRISMASOL BGK 4/2.5 32-4-2.5 MEQ/L IV SOLN
INTRAVENOUS | Status: DC
  Administered 2013-06-08 – 2013-06-09 (×4): via INTRAVENOUS_CENTRAL
  Filled 2013-06-08 (×10): qty 5000

## 2013-06-08 MED ORDER — STERILE WATER FOR INJECTION IV SOLN
INTRAVENOUS | Status: DC
  Administered 2013-06-08 – 2013-06-09 (×4): via INTRAVENOUS_CENTRAL
  Filled 2013-06-08 (×9): qty 150

## 2013-06-08 MED ORDER — VANCOMYCIN HCL IN DEXTROSE 1-5 GM/200ML-% IV SOLN
1000.0000 mg | INTRAVENOUS | Status: DC
  Administered 2013-06-08: 1000 mg via INTRAVENOUS
  Filled 2013-06-08: qty 200

## 2013-06-08 MED ORDER — ALBUMIN HUMAN 25 % IV SOLN
25.0000 g | Freq: Once | INTRAVENOUS | Status: AC
  Administered 2013-06-08: 25 g via INTRAVENOUS
  Filled 2013-06-08: qty 100

## 2013-06-08 MED ORDER — SODIUM CHLORIDE 0.9 % IV SOLN
200.0000 mg | Freq: Two times a day (BID) | INTRAVENOUS | Status: DC
  Administered 2013-06-08 – 2013-06-09 (×2): 200 mg via INTRAVENOUS
  Filled 2013-06-08 (×5): qty 200

## 2013-06-08 MED ORDER — SODIUM BICARBONATE 8.4 % IV SOLN
INTRAVENOUS | Status: DC
  Administered 2013-06-08 – 2013-06-09 (×4): via INTRAVENOUS_CENTRAL
  Filled 2013-06-08 (×8): qty 150

## 2013-06-08 MED ORDER — HEPARIN (PORCINE) 2000 UNITS/L FOR CRRT
INTRAVENOUS_CENTRAL | Status: DC | PRN
  Filled 2013-06-08: qty 1000

## 2013-06-08 MED ORDER — ALBUMIN HUMAN 25 % IV SOLN
50.0000 g | Freq: Once | INTRAVENOUS | Status: AC
  Administered 2013-06-08: 50 g via INTRAVENOUS
  Filled 2013-06-08: qty 200

## 2013-06-08 MED ORDER — VASOPRESSIN 20 UNIT/ML IJ SOLN
0.0300 [IU]/min | INTRAVENOUS | Status: DC
  Filled 2013-06-08: qty 2.5

## 2013-06-08 MED ORDER — HEPARIN SODIUM (PORCINE) 1000 UNIT/ML DIALYSIS
1000.0000 [IU] | INTRAMUSCULAR | Status: DC | PRN
  Filled 2013-06-08: qty 6

## 2013-06-08 MED ORDER — SODIUM BICARBONATE 8.4 % IV SOLN
INTRAVENOUS | Status: DC
  Administered 2013-06-08 – 2013-06-09 (×2): via INTRAVENOUS
  Filled 2013-06-08 (×3): qty 150

## 2013-06-08 NOTE — Progress Notes (Signed)
RN called RT for patient desats.  Increased FIO2 and PEEP.  Performed two recruitment maneuvers without any increase in SpO2.  Got an ABG to show a PaO2 of 102.  MD notified.  Vent changes made.  Will get another ABG in an hour.  RT will continue to monitor.

## 2013-06-08 NOTE — Consult Note (Signed)
[place] for Infectious Disease     Reason for Consult: disseminated Staph aureus bacteremia with necrotizing pneumonia    Referring Physician: Dr. Alva Garnet  Active Problems:   Severe sepsis   Septic shock(785.52)   Acute respiratory failure   PNA (pneumonia)   AKI (acute kidney injury)   . antiseptic oral rinse  15 mL Mouth Rinse q12n4p  . artificial tears  1 application Both Eyes 3 times per day  . budesonide  0.25 mg Nebulization 4 times per day  . ceFTAROline (TEFLARO) IV  200 mg Intravenous Q12H  . chlorhexidine  15 mL Mouth Rinse BID  . Chlorhexidine Gluconate Cloth  6 each Topical Q0600  . famotidine (PEPCID) IV  20 mg Intravenous Q24H  . heparin subcutaneous  5,000 Units Subcutaneous 3 times per day  . hydrocortisone sod succinate (SOLU-CORTEF) inj  50 mg Intravenous Q6H  . insulin aspart  0-9 Units Subcutaneous 6 times per day  . ipratropium-albuterol  3 mL Nebulization Q6H  . mupirocin ointment   Nasal BID  . sodium polystyrene  60 g Oral Once    Recommendations: Vancomycin and ceftaroline   Assessment: He has severe Staph aureus infection in lungs and blood.  There is some evidence of using combination therapy with vancomycin and ceftaroline for severe infections and with low platelets and concern with linezolid will stop that and start vancomycin and ceftaroline.     Antibiotics: linezolid day 2 Cefepime 2 days levaquin 2 days Zosyn 2 days Vancomycin 1 dose initially  HPI: Troy Perez is a 68 y.o. male with COPD, progressive cough, SOB for several days presented 3/24 with progressive symptoms.  He presented to [facility] and in ED required resuscitation and intubated.  Transferred to ICU here for care.  CXR showed pneumonia and has progressed.  He has been septic requiring 2 pressors for support and cultures have grown out Staph aureus in blood and sputum. He has been on linezolid with severe pneumonia but worsening.  CXR shows possible fluid collection.   Patient remains intubated on 2 pressors.     Review of Systems: Review of systems not obtained due to patient factors.  Past Medical History  Diagnosis Date  . Malignant neoplasm of bladder, part unspecified   . COPD (chronic obstructive pulmonary disease)   . Tobacco abuse     History  Substance Use Topics  . Smoking status: Current Every Day Smoker -- 1.00 packs/day for 52 years  . Smokeless tobacco: Not on file  . Alcohol Use: Not on file    No family history on file. No Known Allergies  OBJECTIVE: Blood pressure 139/66, pulse 128, temperature 99.9 F (37.7 C), temperature source Oral, resp. rate 30, height 5\' 7"  (1.702 m), weight 165 lb 9.1 oz (75.1 kg), SpO2 98.00%. General: intubated Skin: no rashes Lungs: diffuse rhonchi Cor: tachy RR without m Abdomen: soft, nt, nd Ext: cool extremities upper and lower   Microbiology: Recent Results (from the past 240 hour(s))  CULTURE, BLOOD (ROUTINE X 2)     Status: None   Collection Time    05/30/2013  6:18 PM      Result Value Ref Range Status   Specimen Description BLOOD RIGHT HAND DRAWN BY RN JVB   Final   Special Requests BOTTLES DRAWN AEROBIC AND ANAEROBIC 3CC   Final   Culture NO GROWTH 3 DAYS   Final   Report Status PENDING   Incomplete  CULTURE, BLOOD (ROUTINE X 2)  Status: None   Collection Time    Jun 21, 2013  6:18 PM      Result Value Ref Range Status   Specimen Description BLOOD LEFT ANTECUBITAL   Final   Special Requests BOTTLES DRAWN AEROBIC AND ANAEROBIC [facility]   Final   Culture  Setup Time     Final   Value: 06/07/2013 13:53     Performed at [facility]   Culture     Final   Value: STAPHYLOCOCCUS AUREUS     Note: RIFAMPIN AND GENTAMICIN SHOULD NOT BE USED AS SINGLE DRUGS FOR TREATMENT OF STAPH INFECTIONS.     Note: Gram Stain Report Called to,Read Back By and Verified With: MOORE K AT [facility] 2118 06/06/13 BY FORSYTH K     Performed at [facility]   Report Status PENDING   Incomplete  MRSA  PCR SCREENING     Status: Abnormal   Collection Time    06/06/13 12:00 AM      Result Value Ref Range Status   MRSA by PCR POSITIVE (*) NEGATIVE Final   Comment:            The GeneXpert MRSA Assay (FDA     approved for NASAL specimens     only), is one component of a     comprehensive MRSA colonization     surveillance program. It is not     intended to diagnose MRSA     infection nor to guide or     monitor treatment for     MRSA infections.     RESULT CALLED TO, READ BACK BY AND VERIFIED WITH:     K. MOORE RN [id number] GREEN R  CULTURE, RESPIRATORY (NON-EXPECTORATED)     Status: None   Collection Time    06/06/13 12:14 AM      Result Value Ref Range Status   Specimen Description TRACHEAL ASPIRATE   Final   Special Requests Normal   Final   Gram Stain     Final   Value: FEW WBC PRESENT,BOTH PMN AND MONONUCLEAR     NO SQUAMOUS EPITHELIAL CELLS SEEN     MODERATE GRAM POSITIVE COCCI IN PAIRS     RARE GRAM NEGATIVE RODS     Performed at [facility]   Culture     Final   Value: MODERATE METHICILLIN RESISTANT STAPHYLOCOCCUS AUREUS     Note: RIFAMPIN AND GENTAMICIN SHOULD NOT BE USED AS SINGLE DRUGS FOR TREATMENT OF STAPH INFECTIONS. CRITICAL RESULT CALLED TO, READ BACK BY AND VERIFIED WITH: JENNIFER @853AM  3.27.15 [place]     Performed at [facility]   Report Status 06/08/2013 FINAL   Final   Organism ID, Bacteria METHICILLIN RESISTANT STAPHYLOCOCCUS AUREUS   Final    COMER, ROBERT, [place] [facility]  [facility] [email or web address] [phone] pager  [phone] cell 06/08/2013, 12:17 PM

## 2013-06-08 NOTE — Progress Notes (Signed)
Potassium level called into Dr Moshe Cipro. Currently 6.4

## 2013-06-08 NOTE — Progress Notes (Signed)
[place] Progress Note Patient Name: Troy Perez DOB: November 08, 1945 MRN: [id number]  Date of Service  06/08/2013   HPI/Events of Note   Hyperkalemia noted on am labs  eICU Interventions  Send recheck BMP stat   Intervention Category Intermediate Interventions: Electrolyte abnormality - evaluation and management  Palestine Mosco S. 06/08/2013, 6:42 AM

## 2013-06-08 NOTE — Progress Notes (Signed)
eLink Physician-Brief Progress Note Patient Name: Troy Perez DOB: Nov 06, 1945 MRN: [id number]  Date of Service  06/08/2013   HPI/Events of Note  Struggling to maintain BP today on CVVHD despite not removing volume; maximum pressor support with three pressors, fingers and toes are cyanotic Discussed with rounding MD and nephrology  eICU Interventions  Lengthy conversation with wife; will not titrate up pressors Will try albumin infusion x1 for BP support If no improvement in BP after albumin will need to stop CVVHD in the next hour Will not add more vasopressor or titrate up pressors currently running Wife, daughter understand and are in agreement   Intervention Category Major Interventions: Acid-Base disturbance - evaluation and management;Electrolyte abnormality - evaluation and management  MCQUAID, DOUGLAS 06/08/2013, 6:28 PM

## 2013-06-08 NOTE — Consult Note (Signed)
[facility] Renal Consultation Note  Requesting MD: Simonds Indication for Consultation: Acute on chronic kidney disease in the setting of sepsis  HPI:  Troy Perez is a 68 y.o. male with past medical history significant for COPD with continued tobacco abuse as well as history of bladder cancer. Patient also seems to have an element of CKD with baseline creatinine in the mid ones last measured in December of 2014 at 1.4. He presented on March 24 with a pneumonia which unfortunately is quite severe and necrotic in nature and he is progressed to requiring intubation and pressor support. His presenting creatinine was 2.5 and it has worsened slowly being 4.5 today. He's also had issues with recurrent hyperkalemia. He is nonoliguric but urine output is only around 600 cc per day. He has had hyponatremia as well. We are consulted to provide CRRT to assist with these metabolic abnormalities   Creat  Date/Time Value Ref Range Status  12/01/2010  8:36 AM 1.4* 0.6 - 1.2 mg/dl Final  05/25/2010 11:05 AM 1.3* 0.6 - 1.2 mg/dl Final     Creatinine  Date/Time Value Ref Range Status  02/21/2013  8:38 AM 1.4* 0.7 - 1.3 mg/dL Final  08/22/2012  9:30 AM 1.5* 0.7 - 1.3 mg/dL Final  02/15/2012  9:04 AM 1.5* 0.7 - 1.3 mg/dL Final     Creatinine, Ser  Date/Time Value Ref Range Status  06/08/2013  8:00 AM 4.53* 0.50 - 1.35 mg/dL Final  06/08/2013  6:32 AM 4.38* 0.50 - 1.35 mg/dL Final  06/08/2013  3:50 AM 4.36* 0.50 - 1.35 mg/dL Final  06/07/2013  3:00 PM 3.40* 0.50 - 1.35 mg/dL Final  06/07/2013  4:50 AM 3.05* 0.50 - 1.35 mg/dL Final  06/06/2013  4:00 AM 2.45* 0.50 - 1.35 mg/dL Final  05/25/2013  6:18 PM 2.55* 0.50 - 1.35 mg/dL Final  10/05/2011  9:29 AM 1.45* 0.50 - 1.35 mg/dL Final  05/12/2011 10:50 AM 1.43* 0.50 - 1.35 mg/dL Final  09/02/2010  9:20 AM 1.60* 0.50 - 1.35 mg/dL Final  04/25/2010  4:18 AM 1.41  0.4 - 1.5 mg/dL Final  04/22/2010 11:15 AM 1.42  0.4 - 1.5 mg/dL Final  03/27/2010  9:07 AM  1.42  0.40 - 1.50 mg/dL Final  02/28/2010  5:35 AM 1.51* 0.4 - 1.5 mg/dL Final  02/26/2010  5:20 AM 1.43  0.4 - 1.5 mg/dL Final  02/24/2010  4:59 AM 1.58* 0.4 - 1.5 mg/dL Final  02/23/2010 11:25 AM 1.77* 0.4 - 1.5 mg/dL Final  02/16/2010  2:40 PM 1.81* 0.4 - 1.5 mg/dL Final  02/10/2010  1:04 PM 1.46  0.40 - 1.50 mg/dL Final  01/06/2010  9:11 AM 1.39  0.40 - 1.50 mg/dL Final  12/30/2009  9:23 AM 1.49  0.40 - 1.50 mg/dL Final  12/16/2009  9:20 AM 1.34  0.40 - 1.50 mg/dL Final  12/09/2009  9:47 AM 1.41  0.40 - 1.50 mg/dL Final  12/02/2009 10:52 AM 1.56* 0.40 - 1.50 mg/dL Final  11/20/2009  3:40 AM 1.55* 0.4 - 1.5 mg/dL Final  11/19/2009  3:41 AM 1.59* 0.4 - 1.5 mg/dL Final  11/18/2009  3:45 AM 1.83* 0.4 - 1.5 mg/dL Final  11/17/2009  3:23 AM 2.02* 0.4 - 1.5 mg/dL Final  11/16/2009  5:02 PM 2.26* 0.4 - 1.5 mg/dL Final  11/11/2009 11:08 AM 1.10  0.40 - 1.50 mg/dL Final  11/04/2009  8:31 AM 1.02  0.40 - 1.50 mg/dL Final  10/29/2009 10:41 AM 1.24  0.40 - 1.50 mg/dL Final  10/07/2009  5:15 AM 1.50  0.4 - 1.5 mg/dL Final  10/03/2009  8:55 AM 1.72* 0.4 - 1.5 mg/dL Final     PMHx:   Past Medical History  Diagnosis Date  . Malignant neoplasm of bladder, part unspecified   . COPD (chronic obstructive pulmonary disease)   . Tobacco abuse     History reviewed. No pertinent past surgical history.  Family Hx: No family history on file.  Social History:  reports that he has been smoking.  He does not have any smokeless tobacco history on file. His alcohol and drug histories are not on file.  Allergies: No Known Allergies  Medications: Prior to Admission medications   Medication Sig Start Date End Date Taking? Authorizing Provider  acetaminophen (TYLENOL) 500 MG tablet Take 1,000 mg by mouth once as needed.   Yes Historical Provider, MD  azithromycin (ZITHROMAX) 250 MG tablet Take 250-500 mg by mouth See admin instructions. Two tablet tablets (559m total) by mouth on day 1, then take one tablet daily (2578m total) every day for 4 days thereafter 06/04/13  Yes Historical Provider, MD  benzonatate (TESSALON) 100 MG capsule Take 100 mg by mouth 3 (three) times daily as needed. For cough 05/11/13  Yes Historical Provider, MD  Fluticasone-Salmeterol (ADVAIR) 250-50 MCG/DOSE AEPB Inhale 1 puff into the lungs 2 (two) times daily.   Yes Historical Provider, MD  predniSONE (DELTASONE) 20 MG tablet Take 20-40 mg by mouth daily. 9 day supply starting on 06/04/2013 06/04/13  Yes Historical Provider, MD  vitamin C (ASCORBIC ACID) 500 MG tablet Take 500 mg by mouth daily.   Yes Historical Provider, MD    I have reviewed the patient's current medications.  Labs:  Results for orders placed during the hospital encounter of 06/06/2013 (from the past 48 hour(s))  GLUCOSE, CAPILLARY     Status: Abnormal   Collection Time    06/06/13 11:50 AM      Result Value Ref Range   Glucose-Capillary 131 (*) 70 - 99 mg/dL  GLUCOSE, CAPILLARY     Status: None   Collection Time    06/06/13  3:32 PM      Result Value Ref Range   Glucose-Capillary 98  70 - 99 mg/dL  GLUCOSE, CAPILLARY     Status: None   Collection Time    06/06/13  7:22 PM      Result Value Ref Range   Glucose-Capillary 91  70 - 99 mg/dL  MAGNESIUM     Status: None   Collection Time    06/06/13 10:44 PM      Result Value Ref Range   Magnesium 2.1  1.5 - 2.5 mg/dL  PHOSPHORUS     Status: Abnormal   Collection Time    06/06/13 10:44 PM      Result Value Ref Range   Phosphorus 6.6 (*) 2.3 - 4.6 mg/dL  GLUCOSE, CAPILLARY     Status: Abnormal   Collection Time    06/07/13 12:17 AM      Result Value Ref Range   Glucose-Capillary 105 (*) 70 - 99 mg/dL   Comment 1 Documented in Chart     Comment 2 Notify RN    GLUCOSE, CAPILLARY     Status: None   Collection Time    06/07/13  3:45 AM      Result Value Ref Range   Glucose-Capillary 90  70 - 99 mg/dL  CBC     Status: None   Collection Time  06/07/13  4:50 AM      Result Value Ref Range   WBC 6.2  4.0  - 10.5 K/uL   RBC 4.76  4.22 - 5.81 MIL/uL   Hemoglobin 15.1  13.0 - 17.0 g/dL   HCT 43.1  39.0 - 52.0 %   MCV 90.5  78.0 - 100.0 fL   MCH 31.7  26.0 - 34.0 pg   MCHC 35.0  30.0 - 36.0 g/dL   RDW 14.8  11.5 - 15.5 %   Platelets 176  150 - 400 K/uL  BASIC METABOLIC PANEL     Status: Abnormal   Collection Time    06/07/13  4:50 AM      Result Value Ref Range   Sodium 129 (*) 137 - 147 mEq/L   Potassium 5.8 (*) 3.7 - 5.3 mEq/L   Comment: HEMOLYSIS AT THIS LEVEL MAY AFFECT RESULT   Chloride 92 (*) 96 - 112 mEq/L   CO2 20  19 - 32 mEq/L   Glucose, Bld 160 (*) 70 - 99 mg/dL   BUN 61 (*) 6 - 23 mg/dL   Creatinine, Ser 3.05 (*) 0.50 - 1.35 mg/dL   Calcium 6.7 (*) 8.4 - 10.5 mg/dL   GFR calc non Af Amer 19 (*) >90 mL/min   GFR calc Af Amer 22 (*) >90 mL/min   Comment: (NOTE)     The eGFR has been calculated using the CKD EPI equation.     This calculation has not been validated in all clinical situations.     eGFR's persistently <90 mL/min signify possible Chronic Kidney     Disease.  MAGNESIUM     Status: None   Collection Time    06/07/13  5:00 AM      Result Value Ref Range   Magnesium 2.1  1.5 - 2.5 mg/dL  PHOSPHORUS     Status: Abnormal   Collection Time    06/07/13  5:00 AM      Result Value Ref Range   Phosphorus 6.7 (*) 2.3 - 4.6 mg/dL  GLUCOSE, CAPILLARY     Status: Abnormal   Collection Time    06/07/13  7:56 AM      Result Value Ref Range   Glucose-Capillary 115 (*) 70 - 99 mg/dL  GLUCOSE, CAPILLARY     Status: Abnormal   Collection Time    06/07/13 11:56 AM      Result Value Ref Range   Glucose-Capillary 45 (*) 70 - 99 mg/dL   Comment 1 Notify RN     Comment 2 Documented in Chart    GLUCOSE, CAPILLARY     Status: None   Collection Time    06/07/13 12:54 PM      Result Value Ref Range   Glucose-Capillary 84  70 - 99 mg/dL  BASIC METABOLIC PANEL     Status: Abnormal   Collection Time    06/07/13  3:00 PM      Result Value Ref Range   Sodium 127 (*) 137 -  147 mEq/L   Potassium 5.3  3.7 - 5.3 mEq/L   Chloride 91 (*) 96 - 112 mEq/L   CO2 18 (*) 19 - 32 mEq/L   Glucose, Bld 259 (*) 70 - 99 mg/dL   BUN 64 (*) 6 - 23 mg/dL   Creatinine, Ser 3.40 (*) 0.50 - 1.35 mg/dL   Calcium 6.6 (*) 8.4 - 10.5 mg/dL   GFR calc non Af Amer 17 (*) >90 mL/min  GFR calc Af Amer 20 (*) >90 mL/min   Comment: (NOTE)     The eGFR has been calculated using the CKD EPI equation.     This calculation has not been validated in all clinical situations.     eGFR's persistently <90 mL/min signify possible Chronic Kidney     Disease.  GLUCOSE, CAPILLARY     Status: Abnormal   Collection Time    06/07/13  3:32 PM      Result Value Ref Range   Glucose-Capillary 139 (*) 70 - 99 mg/dL  GLUCOSE, CAPILLARY     Status: Abnormal   Collection Time    06/07/13  7:10 PM      Result Value Ref Range   Glucose-Capillary 165 (*) 70 - 99 mg/dL  POCT I-STAT 3, BLOOD GAS (G3+)     Status: Abnormal   Collection Time    06/07/13 11:56 PM      Result Value Ref Range   pH, Arterial 7.072 (*) 7.350 - 7.450   pCO2 arterial 72.3 (*) 35.0 - 45.0 mmHg   pO2, Arterial 99.0  80.0 - 100.0 mmHg   Bicarbonate 21.2  20.0 - 24.0 mEq/L   TCO2 23  0 - 100 mmol/L   O2 Saturation 94.0     Acid-base deficit 11.0 (*) 0.0 - 2.0 mmol/L   Patient temperature 97.8 F     Collection site ARTERIAL LINE     Drawn by Operator     Sample type ARTERIAL     Comment NOTIFIED PHYSICIAN    GLUCOSE, CAPILLARY     Status: Abnormal   Collection Time    06/08/13 12:51 AM      Result Value Ref Range   Glucose-Capillary 125 (*) 70 - 99 mg/dL   Comment 1 Documented in Chart     Comment 2 Notify RN    POCT I-STAT 3, BLOOD GAS (G3+)     Status: Abnormal   Collection Time    06/08/13  1:28 AM      Result Value Ref Range   pH, Arterial 7.100 (*) 7.350 - 7.450   pCO2 arterial 64.5 (*) 35.0 - 45.0 mmHg   pO2, Arterial 67.0 (*) 80.0 - 100.0 mmHg   Bicarbonate 20.1  20.0 - 24.0 mEq/L   TCO2 22  0 - 100 mmol/L   O2  Saturation 85.0     Acid-base deficit 11.0 (*) 0.0 - 2.0 mmol/L   Patient temperature 97.8 F     Collection site ARTERIAL LINE     Drawn by Operator     Sample type ARTERIAL     Comment NOTIFIED PHYSICIAN    POCT I-STAT 3, BLOOD GAS (G3+)     Status: Abnormal   Collection Time    06/08/13  3:49 AM      Result Value Ref Range   pH, Arterial 7.055 (*) 7.350 - 7.450   pCO2 arterial 65.5 (*) 35.0 - 45.0 mmHg   pO2, Arterial 82.0  80.0 - 100.0 mmHg   Bicarbonate 17.9 (*) 20.0 - 24.0 mEq/L   TCO2 20  0 - 100 mmol/L   O2 Saturation 87.0     Acid-base deficit 13.0 (*) 0.0 - 2.0 mmol/L   Patient temperature 101.2 F     Collection site ARTERIAL LINE     Drawn by Nurse     Sample type ARTERIAL     Comment NOTIFIED PHYSICIAN    CBC     Status: Abnormal   Collection  Time    06/08/13  3:50 AM      Result Value Ref Range   WBC 27.8 (*) 4.0 - 10.5 K/uL   RBC 4.16 (*) 4.22 - 5.81 MIL/uL   Hemoglobin 13.0  13.0 - 17.0 g/dL   HCT 37.9 (*) 39.0 - 52.0 %   MCV 91.1  78.0 - 100.0 fL   MCH 31.3  26.0 - 34.0 pg   MCHC 34.3  30.0 - 36.0 g/dL   RDW 15.0  11.5 - 15.5 %   Platelets 61 (*) 150 - 400 K/uL   Comment: SPECIMEN CHECKED FOR CLOTS     REPEATED TO VERIFY     PLATELET COUNT CONFIRMED BY SMEAR     DELTA CHECK NOTED  BASIC METABOLIC PANEL     Status: Abnormal   Collection Time    06/08/13  3:50 AM      Result Value Ref Range   Sodium 127 (*) 137 - 147 mEq/L   Potassium 6.3 (*) 3.7 - 5.3 mEq/L   Comment: HEMOLYSIS AT THIS LEVEL MAY AFFECT RESULT   Chloride 89 (*) 96 - 112 mEq/L   CO2 15 (*) 19 - 32 mEq/L   Glucose, Bld 153 (*) 70 - 99 mg/dL   BUN 74 (*) 6 - 23 mg/dL   Creatinine, Ser 4.36 (*) 0.50 - 1.35 mg/dL   Calcium 6.6 (*) 8.4 - 10.5 mg/dL   GFR calc non Af Amer 13 (*) >90 mL/min   GFR calc Af Amer 15 (*) >90 mL/min   Comment: (NOTE)     The eGFR has been calculated using the CKD EPI equation.     This calculation has not been validated in all clinical situations.     eGFR's  persistently <90 mL/min signify possible Chronic Kidney     Disease.  GLUCOSE, CAPILLARY     Status: Abnormal   Collection Time    06/08/13  3:55 AM      Result Value Ref Range   Glucose-Capillary 134 (*) 70 - 99 mg/dL   Comment 1 Documented in Chart     Comment 2 Notify RN    BASIC METABOLIC PANEL     Status: Abnormal   Collection Time    06/08/13  6:32 AM      Result Value Ref Range   Sodium 126 (*) 137 - 147 mEq/L   Potassium 7.5 (*) 3.7 - 5.3 mEq/L   Comment: CRITICAL RESULT CALLED TO, READ BACK BY AND VERIFIED WITH:     BISHOP J RN 06/08/13 0727 COSTELLO B     HEMOLYSIS AT THIS LEVEL MAY AFFECT RESULT   Chloride 89 (*) 96 - 112 mEq/L   CO2 16 (*) 19 - 32 mEq/L   Glucose, Bld 136 (*) 70 - 99 mg/dL   BUN 74 (*) 6 - 23 mg/dL   Creatinine, Ser 4.38 (*) 0.50 - 1.35 mg/dL   Calcium 6.7 (*) 8.4 - 10.5 mg/dL   GFR calc non Af Amer 13 (*) >90 mL/min   GFR calc Af Amer 15 (*) >90 mL/min   Comment: (NOTE)     The eGFR has been calculated using the CKD EPI equation.     This calculation has not been validated in all clinical situations.     eGFR's persistently <90 mL/min signify possible Chronic Kidney     Disease.  GLUCOSE, CAPILLARY     Status: Abnormal   Collection Time    06/08/13  7:58 AM  Result Value Ref Range   Glucose-Capillary 122 (*) 70 - 99 mg/dL  BASIC METABOLIC PANEL     Status: Abnormal   Collection Time    06/08/13  8:00 AM      Result Value Ref Range   Sodium 127 (*) 137 - 147 mEq/L   Potassium 6.2 (*) 3.7 - 5.3 mEq/L   Comment: DELTA CHECK NOTED     NO VISIBLE HEMOLYSIS   Chloride 90 (*) 96 - 112 mEq/L   CO2 16 (*) 19 - 32 mEq/L   Glucose, Bld 127 (*) 70 - 99 mg/dL   BUN 74 (*) 6 - 23 mg/dL   Creatinine, Ser 4.53 (*) 0.50 - 1.35 mg/dL   Calcium 6.7 (*) 8.4 - 10.5 mg/dL   GFR calc non Af Amer 12 (*) >90 mL/min   GFR calc Af Amer 14 (*) >90 mL/min   Comment: (NOTE)     The eGFR has been calculated using the CKD EPI equation.     This calculation has  not been validated in all clinical situations.     eGFR's persistently <90 mL/min signify possible Chronic Kidney     Disease.     ROS:  Review of systems not obtained due to patient factors.  Physical Exam: Filed Vitals:   06/08/13 0900  BP: 118/51  Pulse: 129  Temp:   Resp: 30     General: Patient is pale and cyanotic. He is sedated on the ventilator HEENT: Pupils are reactive mucous membranes moist Neck: There is no jugular venous distention Heart: Tachycardic Lungs: Coarse breath sounds bilaterally Abdomen: Soft, nontender, nondistended Extremities: Mottled no significant edema Skin: Cool and mottled Neuro: Sedated on vent  Assessment/Plan: 68 year old white male with acute on chronic renal failure in the setting of necrotizing pneumonia requiring intubation and pressors 1.Renal- AKI in the setting of above. To a point where he requires dialysis support due to metabolic acidosis and hyperkalemia. Given hemodynamic instability we'll need to be in the form of CRRT. I will use no heparin, attempt gentle fluid removal 2. Hypertension/volume  - is 9 L positive with marginal urine output and an FiO2 of 80. We'll attempt gentle volume removal 3. metabolic acidosis- probably has an element of acidosis due to his ileal conduit. Will use dialysis to assist in correcting 4. Anemia  - surprisingly not an issue 5. Hyperkalemia- dialysis should assist in managing  Thank you for consultation. We'll continue to follow with you  Filomeno Cromley A 06/08/2013, 10:33 AM

## 2013-06-08 NOTE — Progress Notes (Signed)
06/08/13 0900  Clinical Encounter Type  Visited With Family  Visit Type Spiritual support;Social support;Critical Care  Spiritual Encounters  Spiritual Needs Emotional  Stress Factors  Family Stress Factors Loss of control   Paged to support family in difficulty coping.  Consulted with RN, then visited with wife Holley Raring, daughter Maudie Mercury and SIL Hollice Espy, and daughter Joseph Art in waiting room to introduce [facility] and chaplain availability.  Family appreciative.  Mrs Eoff has a strong emotional need to be at bedside for support, and dtrs want to support her in this.  In order to honor unit rule that family is not allowed to spend the night, while meeting the family need to have a wakeful family member at bedside 24/7, family is planning a round-the-clock rotation.  Provided pastoral presence and reflective listening, while encouraging self-care and balance as family seeks to support pt.  Referred to day chaplain for follow-up support.  [place], [place]

## 2013-06-08 NOTE — Progress Notes (Signed)
06/08/13 0610   CRITICAL VALUE ALERT  Critical value received: K - 6.3  Date of notification:  06/08/13  Time of notification:  0610  Critical value read back: YES  Nurse who received alert:  Wyn Quaker RN   MD notified (1st page): eLink MD Lamonte Sakai   I: repeat BMET stat  Wyn Quaker RN

## 2013-06-08 NOTE — Progress Notes (Signed)
PULMONARY / CRITICAL CARE MEDICINE   Name: Troy Perez MRN: [id number] DOB: 1945-10-12    ADMISSION DATE:  2013-07-03 CONSULTATION DATE:  3/24  REFERRING MD :  Dr. Wyvonnia Dusky PRIMARY SERVICE: PCCM  CHIEF COMPLAINT:  Acute Respiratory Failure   BRIEF PATIENT DESCRIPTION: 68 y/o M with PMH of COPD presented 3/24 with L PNA, progressed to resp fx in [facility] ER, intubated & tx to [facility] for further care.   SIGNIFICANT EVENTS/STUDIES:    3/24 presented to [facility] ER with 48 h hx of increasing cough, SOB, chest tightness and low back pain.  Found to have L PNA. Progressed to resp fx requiring intubation.  3/25 BP stabilized on high dose vasopressors/stress dose steroids. Uo improving. Able to F/C on WUA 3/25 PM: phenylephrine added for persistent shock. Severe ventilator dyssynchrony requiring NMBs.  3/26 Weaning norepinephrine. Remains on PE, VP and stress dose steroids. Copious bloody ET secretions. 2/2 blood cultures positive for staph aureus. Resp culture + for staph aureus. K+ 5/8 > kayexalate given 3/27 Worsening renal failure, acidosis, hyperkalemia. Renal consult. CRRT initiated. Wife and daughters updated in detail and all in agreement with DNR in event of cardiac arrest. 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 Small R effusion with RLL ATX vs infiltrate  LINES / TUBES: ETT 3/24 >>  R IJ CVL 3/25 >>  R radial A-line 3/25 >>  R fem HD cath 3/27 >>   CULTURES: MRSA PCR 3/24 >> POS U. Strep 3/24 >> NEG Resp 3/24 >> MRSA Blood 3/24>> 2/2 MRSA  ANTIBIOTICS: Vanc 3/24 >> 3/26 Levoflox 3/24 >> 3/26 Cefepime 3/24 >> 3/27 Linezolid 3/26 >> 3/27 3/27 ID CONSULT  Ceftaroline 3/27 >> Vanc 3/27 >>   SUBJECTIVE:  Sedated, paralyzed  VITAL SIGNS: Temp:  [97.5 F (36.4 C)-101.2 F (38.4 C)] 97.5 F (36.4 C) (03/27 1251) Pulse Rate:  [30-133] 123 (03/27 1415) Resp:  [18-30] 30 (03/27 1415) BP: (86-142)/(45-119) 133/47 mmHg (03/27 1400) SpO2:   [52 %-100 %] 100 % (03/27 1416) Arterial Line BP: (84-113)/(46-61) 99/61 mmHg (03/27 1230) FiO2 (%):  [60 %-100 %] 70 % (03/27 1544)  HEMODYNAMICS:    VENTILATOR SETTINGS: Vent Mode:  [-] PRVC FiO2 (%):  [60 %-100 %] 70 % Set Rate:  [20 bmp-30 bmp] 30 bmp Vt Set:  [470 mL-500 mL] 470 mL PEEP:  [5 cmH20-14 cmH20] 14 cmH20 Plateau Pressure:  [24 cmH20-35 cmH20] 35 cmH20  INTAKE / OUTPUT: Intake/Output     03/26 0701 - 03/27 0700 03/27 0701 - 03/28 0700   I.V. (mL/kg) 3084.2 (41.1) 907 (12.1)   NG/GT 700    IV Piggyback 650 100   Total Intake(mL/kg) 4434.2 (59) 1007 (13.4)   Urine (mL/kg/hr) 548 (0.3) 50 (0.1)   Emesis/NG output 950 (0.5)    Other  74 (0.1)   Total Output 1498 124   Net +2936.2 +883        Emesis Occurrence 1 x      PHYSICAL EXAMINATION: General:  Intubated, sedated, paralyzed Neuro:  Sedated, paralyzed HEENT: NCAT, PERRL Cardiovascular:  RRR s M Lungs: diffuse L rhonchi, no wheezes noted Abdomen:  Soft, NT, NABS Ext: cool, acrocyanosis in all extremities, minimal edema  LABS: I have reviewed all of today's lab results. 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 Hyperkalemia P:   Monitor BMET intermittently Monitor I/Os Correct electrolytes as indicated Renal consult and CRRT 3/27  GASTROINTESTINAL A:   No acute issues P:   SUP: IV famotidine Cont TFs at trickle while on NMBs  HEMATOLOGIC A:    Thrombocytopenia - DIC vs adverse drug effect (linezolid) P:  DVT px: SQ heparin Monitor CBC intermittently Transfuse per usual ICU guidelines  INFECTIOUS A:   Severe necrotizing staph PNA  Severe sepsis H/O pseudomonas PNA P:   Micro and abx as above  ENDOCRINE A:   DM 2, controlled Episodic hypoglycemia P:   Cont CBGs/SSI q 4 hrs - changed to sens scale 3/25  NEUROLOGIC A:   Acute Encephalopathy, septic Vent dyssynchrony P:   Cont Propofol and fent gtt - BIS goal 40-60 Cont NMBs for now Daily WUA if/when indicated   Family updated @ bedside. Cont full aggressive medical support. DNR in event of cardiac arrest  I have personally obtained a history, examined the patient, evaluated laboratory and imaging results, formulated the assessment and plan and placed orders.  CRITICAL CARE: The patient is critically ill with multiple organ systems failure and requires high complexity decision making for assessment and support, frequent evaluation and titration of therapies, application of advanced monitoring technologies and extensive interpretation of multiple databases. Critical Care Time devoted to patient care services described in this note is 55 minutes.   Merton Border, MD ; [facility] [phone].  After 5:30 PM or weekends, call [phone]  06/08/2013, 4:48 PM

## 2013-06-08 NOTE — Progress Notes (Signed)
Chaplain received referral from overnight chaplain for continual family support. Presented to patient's wife, two daughters, and SIL in patient's room. Chaplain observed that the daughters spoke mostly for the wife. They said that they were "moving on" and "getting over" their anger and frustration at being asked to leave patient's room last night. They expressed consolation at having greater understanding of policy and reasons for it. They also said that, as a family, they feel more consoled and at peace when they understand patient's medical condition and treatment thoroughly. They plan to rotate time with patient so that he has a family member with him 24/7. Chaplain listened empathically and compassionately to their concerns, providing emotional and spiritual support. They are aware of chaplain services and availability. Will follow up as able, but please page if needed.   [place], [place]

## 2013-06-08 NOTE — Progress Notes (Signed)
06/07/13 1945  Previous PM shift and AM shift turned Propofol gtt off in order to decrease vasopressor needs, but pt was on nimbex gtt. Pt has been on nimbex gtt for over 14hrs with only a Fentanyl gtt infusing. Informed CCM eLink MD Joya Gaskins. Joya Gaskins gives order for Versed gtt.   Family updated about addition of Versed gtt.   Wyn Quaker RN

## 2013-06-08 NOTE — Progress Notes (Signed)
[facility] Progress Note Patient Name: Troy Perez DOB: May 18, 1945 MRN: [id number]  Date of Service  06/08/2013   HPI/Events of Note   Desaturation, increased PEEP and FiO2 needs.   Recent Labs Lab 05/21/2013 1733 05/31/2013 2104 06/06/13 0138 06/06/13 0158 06/06/13 0354 06/07/13 2356  PHART 7.425 7.224* 7.148*  --  7.177* 7.072*  PCO2ART 24.0* 38.8 46.3*  --  52.6* 72.3*  PO2ART 73.7* 63.3* 175.0*  --  77.0* 99.0  HCO3 15.5* 15.4* 16.0*  --  19.4* 21.2  TCO2 13.0 14.0 17  --  21 23  O2SAT 95.8 88.9 99.0 69.9 91.0 94.0   Note SpO2 not correlating with pO2 He has unilateral infiltrates, L >> R, is not on ARDS protocol  eICU Interventions  - leave him paralyzed - decrease Vt to achieve Pplat < 30 - increase RR and thereby Ve - titrate PEEP and FiO2 according to ARDS card   Intervention Category Major Interventions: Respiratory failure - evaluation and management  Aly Seidenberg S. 06/08/2013, 12:02 AM

## 2013-06-08 NOTE — Procedures (Signed)
Central Venous Catheter Insertion Procedure Note Troy Perez [id number] 12/12/1945  Procedure: Insertion of Central Venous dialysis Catheter Indications: dialysis   Procedure Details Consent: Risks of procedure as well as the alternatives and risks of each were explained to the (patient/caregiver).  Consent for procedure obtained. Time Out: Verified patient identification, verified procedure, site/side was marked, verified correct patient position, special equipment/implants available, medications/allergies/relevent history reviewed, required imaging and test results available.  Performed Real time [place] was used to ID and cannulate vessel  Maximum sterile technique was used including antiseptics, cap, gloves, gown, hand hygiene, mask and sheet. Skin prep: Chlorhexidine; local anesthetic administered A antimicrobial bonded/coated triple lumen catheter was placed in the right femoral vein due to patient being a dialysis patient using the Seldinger technique.  Evaluation Blood flow good Complications: No apparent complications Patient did tolerate procedure well. Chest X-ray ordered to verify placement.  CXR: not indicated .  BABCOCK,PETE 06/08/2013, 1:03 PM  I was present for and supervised the entire procedure  Merton Border, MD ; [facility] [phone].  After 5:30 PM or weekends, call [phone]

## 2013-06-08 NOTE — Progress Notes (Signed)
ANTIBIOTIC CONSULT NOTE - INITIAL  Pharmacy Consult for Vancomycin/ceftaroline Indication: bactermia/PNA  No Known Allergies  Patient Measurements: Height: 5\' 7"  (170.2 cm) Weight: 165 lb 9.1 oz (75.1 kg) IBW/kg (Calculated) : 66.1  Vital Signs: Temp: 99.9 F (37.7 C) (03/27 0900) Temp src: Oral (03/27 0900) BP: 139/66 mmHg (03/27 1200) Pulse Rate: 128 (03/27 1200) Intake/Output from previous day: 03/26 0701 - 03/27 0700 In: 4434.2 [I.V.:3084.2; NG/GT:700; IV Piggyback:650] Out: 1498 [Urine:548; Emesis/NG output:950] Intake/Output from this shift:    Labs:  Recent Labs  06/06/13 0400 06/07/13 0450  06/08/13 0350 06/08/13 0632 06/08/13 0800  WBC 4.1 6.2  --  27.8*  --   --   HGB 14.9 15.1  --  13.0  --   --   PLT 212 176  --  61*  --   --   CREATININE 2.45* 3.05*  < > 4.36* 4.38* 4.53*  < > = values in this interval not displayed. Estimated Creatinine Clearance: 14.4 ml/min (by C-G formula based on Cr of 4.53). No results found for this basename: VANCOTROUGH, VANCOPEAK, VANCORANDOM, GENTTROUGH, GENTPEAK, GENTRANDOM, TOBRATROUGH, TOBRAPEAK, TOBRARND, AMIKACINPEAK, AMIKACINTROU, AMIKACIN,  in the last 72 hours   Microbiology: Recent Results (from the past 720 hour(s))  CULTURE, BLOOD (ROUTINE X 2)     Status: None   Collection Time    05/29/2013  6:18 PM      Result Value Ref Range Status   Specimen Description BLOOD RIGHT HAND DRAWN BY RN JVB   Final   Special Requests BOTTLES DRAWN AEROBIC AND ANAEROBIC 3CC   Final   Culture NO GROWTH 3 DAYS   Final   Report Status PENDING   Incomplete  CULTURE, BLOOD (ROUTINE X 2)     Status: None   Collection Time    05/21/2013  6:18 PM      Result Value Ref Range Status   Specimen Description BLOOD LEFT ANTECUBITAL   Final   Special Requests BOTTLES DRAWN AEROBIC AND ANAEROBIC [facility]   Final   Culture  Setup Time     Final   Value: 06/07/2013 13:53     Performed at [facility]   Culture     Final   Value:  STAPHYLOCOCCUS AUREUS     Note: RIFAMPIN AND GENTAMICIN SHOULD NOT BE USED AS SINGLE DRUGS FOR TREATMENT OF STAPH INFECTIONS.     Note: Gram Stain Report Called to,Read Back By and Verified With: MOORE K AT [facility] 2118 06/06/13 BY FORSYTH K     Performed at [facility]   Report Status PENDING   Incomplete  MRSA PCR SCREENING     Status: Abnormal   Collection Time    06/06/13 12:00 AM      Result Value Ref Range Status   MRSA by PCR POSITIVE (*) NEGATIVE Final   Comment:            The GeneXpert MRSA Assay (FDA     approved for NASAL specimens     only), is one component of a     comprehensive MRSA colonization     surveillance program. It is not     intended to diagnose MRSA     infection nor to guide or     monitor treatment for     MRSA infections.     RESULT CALLED TO, READ BACK BY AND VERIFIED WITH:     K. MOORE RN [phone] [place] (NON-EXPECTORATED)  Status: None   Collection Time    06/06/13 12:14 AM      Result Value Ref Range Status   Specimen Description TRACHEAL ASPIRATE   Final   Special Requests Normal   Final   Gram Stain     Final   Value: FEW WBC PRESENT,BOTH PMN AND MONONUCLEAR     NO SQUAMOUS EPITHELIAL CELLS SEEN     MODERATE GRAM POSITIVE COCCI IN PAIRS     RARE GRAM NEGATIVE RODS     Performed at [facility]   Culture     Final   Value: MODERATE METHICILLIN RESISTANT STAPHYLOCOCCUS AUREUS     Note: RIFAMPIN AND GENTAMICIN SHOULD NOT BE USED AS SINGLE DRUGS FOR TREATMENT OF STAPH INFECTIONS. CRITICAL RESULT CALLED TO, READ BACK BY AND VERIFIED WITH: JENNIFER @853AM  3.27.15 [place]     Performed at [facility]   Report Status 06/08/2013 FINAL   Final   Organism ID, Bacteria METHICILLIN RESISTANT STAPHYLOCOCCUS AUREUS   Final    Medical History: Past Medical History  Diagnosis Date  . Malignant neoplasm of bladder, part unspecified   . COPD (chronic obstructive pulmonary disease)   . Tobacco abuse      Medications:  Prescriptions prior to admission  Medication Sig Dispense Refill  . acetaminophen (TYLENOL) 500 MG tablet Take 1,000 mg by mouth once as needed.      Marland Kitchen azithromycin (ZITHROMAX) 250 MG tablet Take 250-500 mg by mouth See admin instructions. Two tablet tablets (500mg  total) by mouth on day 1, then take one tablet daily (250mg  total) every day for 4 days thereafter      . benzonatate (TESSALON) 100 MG capsule Take 100 mg by mouth 3 (three) times daily as needed. For cough      . Fluticasone-Salmeterol (ADVAIR) 250-50 MCG/DOSE AEPB Inhale 1 puff into the lungs 2 (two) times daily.      . predniSONE (DELTASONE) 20 MG tablet Take 20-40 mg by mouth daily. 9 day supply starting on 06/04/2013      . vitamin C (ASCORBIC ACID) 500 MG tablet Take 500 mg by mouth daily.       Assessment: Pt now has staph bacteremia and PNA. Condition is getting worse. ID is now involved. Will use combo therapy with ceftaroline and vanc for this. Pt also has renal failure so will have to adjust dose accordingly.   Goal of Therapy:  Vancomycin trough level 15-20 mcg/ml  Plan:   Ceftaroline 200mg  IV q12 Vanc 1g IV q48 Get trough at steady state if needed

## 2013-06-08 NOTE — Progress Notes (Signed)
06/08/13 0015  Pt currently desating on monitor with poor wave form. Pt vasoconstricted from vasopressor gtts. Pt not responding to increases in FiO2 and PEEP. ABG retrieved.  ABG Results pH - 7.07 PCO2 - 72.3  PO2 - 99 Bicarb - 21.2 O2Sat - 94%  Due to ABG results (see CCM MD Byrum's note):  -increased RR -decrease FiO2 and PEEP to ARDs protocol -Titrate FiO2 based on ABG not SpO2 due to poor wave form and vasoconstriction  Wyn Quaker RN

## 2013-06-08 NOTE — Progress Notes (Signed)
06/08/13 0130  Repeat ABG done. ABG results:   pH - 7.09 PCO2 - 65.7 PO2 - 69 Bicarb - 20.1 spO2 - 85%  eLink MD Byrum informed. No new orders given.   Wyn Quaker RN

## 2013-06-08 NOTE — Progress Notes (Signed)
Discussed with Dr Princella Pellegrini and Dr Lake Bells, concerns of hypotention since CRRT started. Per Dr Princella Pellegrini ok, to keep even. Per Dr Lake Bells, ok to turn of paralytic and decrease sedation. No Plans to wean of  Pressors till more stable.

## 2013-06-09 ENCOUNTER — Inpatient Hospital Stay (HOSPITAL_COMMUNITY): Payer: Medicare HMO

## 2013-06-09 LAB — GLUCOSE, CAPILLARY: GLUCOSE-CAPILLARY: 91 mg/dL (ref 70–99)

## 2013-06-09 LAB — CULTURE, BLOOD (ROUTINE X 2)

## 2013-06-09 MED ORDER — SODIUM CHLORIDE 0.9 % IV SOLN
5.0000 mg/h | INTRAVENOUS | Status: DC
  Administered 2013-06-09: 5 mg/h via INTRAVENOUS
  Filled 2013-06-09: qty 10

## 2013-06-09 MED ORDER — DEXTROSE 5 % IV SOLN: INTRAVENOUS | Status: DC

## 2013-06-09 MED ORDER — MORPHINE SULFATE 10 MG/ML IJ SOLN: 10.0000 mg | Freq: Once | INTRAMUSCULAR | Status: DC

## 2013-06-10 LAB — CULTURE, BLOOD (ROUTINE X 2): CULTURE: NO GROWTH

## 2013-06-11 NOTE — Discharge Summary (Signed)
DEATH SUMMARY  DATE OF ADMISSION:  05/26/2013  DATE OF DISCHARGE/DEATH:  2013/06/13  ADMISSION DIAGNOSES:   Community acquired PNA Severe sepsis/septic shock Acute respiratory failure Acute renal failure Anion gap metabolic acidosis Acute encephalopathy DM 2 COPD Hx of bladder cancer  Hx of pseudomonas PNA 2011  DISCHARGE DIAGNOSES:   Necrotizing MRSA PNA Severe sepsis Refractory septic shock Acute respiratory failure Acute renal failure Anion gap metabolic acidosis Hyperkalemia Respiratory acidosis Acute encephalopathy Thrombocytopenia  DM 2  COPD Hx of bladder cancer  Hx of pseudomonas PNA 2011   PRESENTATION:   Pt was admitted with the following HPI and the above admission diagnoses:  HISTORY OF PRESENT ILLNESS: 68 y/o M with PMH of DM, bladder cancer s/p urostomy in 2012 with last chemo in 2011 (reportedly cancer free with last ONC appt in 02/2013) and COPD presented 06/10/22 to [facility] ER on 10-Jun-2022 with progressive cough, increased shortness of breath, chest tightness and low back pain from coughing. He was seen by his PCP on 3/23 for similar complaints and treated with prednisone & azithromycin without improvement. Patient activated EMS afternoon of 06/10/2022 with approximately 2 hours of worsening symptoms. EMS treated the patient with bronchodilators and 125 mg of solumedrol without improvement. Patient presented to the ER in respiratory distress - diaphoretic with associated tachycardia. Initial CXR noted L diffuse interstitial opacities. He was placed on bipap with improvement in symptoms. He later became agitated and was taken off bipap. Follow up ABG at that time noted worsening of oxygenation & acidosis. Decision was made to intubate patient prior to transfer. Patient was intubated in ER & tx to [facility] for further evaluation. Other work up notes: Na 131, sr Cr 2.55 (up from baseline of ~1.4), glucose of 182, albumin 2.8, troponin < 0.30, proBNP 4450, lactate of 5.1, wbc 7.1,  platelets 240 and hgb 16.2.    HOSPITAL COURSE:   SIGNIFICANT EVENTS/STUDIES:  Jun 10, 2022 presented to [facility] ER with 48 h hx of increasing cough, SOB, chest tightness and low back pain. Found to have L PNA. Progressed to resp fx requiring intubation.  3/25 BP stabilized on high dose vasopressors/stress dose steroids. Uo improving. Able to F/C on WUA  3/25 PM: phenylephrine added for persistent shock. Severe ventilator dyssynchrony requiring NMBs.  3/26 Weaning norepinephrine. Remains on PE, VP and stress dose steroids. Copious bloody ET secretions. 2/2 blood cultures positive for staph aureus. Resp culture + for staph aureus. K+ 5/8 > kayexalate given  3/27 Worsening renal failure, acidosis, hyperkalemia. Renal consult. CRRT initiated. Wife and daughters updated in detail and all in agreement with DNR in event of cardiac arrest. Full aggressive support short of ACLS  3/27 CT chest: Extensive AS dz on L with minimal effusion. Small R effusion with RLL ATX vs infiltrate 3/27 ID consult to assist with abx mgmt 06-14-2022 AM - progressive hypotension despite maximum dose vasopressors. PCCM MD discussed with family and it was deemed that support care was futile. CRRT discontinued. Morphine infusion initiated. Mechanical ventilation discontinued. Asystole ensued shortly thereafter.   Cause of death:  Refractory septic shock due to  Necrotizing community acquired MRSA PNA  Contributing factors: Acute resp failure Acute renal failure MODS COPD DM 2  Autopsy: no   Smoking: Yes   Merton Border, MD

## 2013-06-13 NOTE — Progress Notes (Addendum)
05/29/2013 0340  Per conversation with CCM MD Siquieros and pt family, CRRT has been stopped with plans to make pt full comfort care once more family arrives to the unit. Morphine gtt ordered for pt. Once family arrives and family is ready, pt will be extubated with plans for full comfort care.   [facility] called. Spoke with Rocky Crafts, who stated pt is not suitable for organ, tissue or eye donation at this time. CDS sheet placed in chart. CDS informed RN to call back with official time of death. CDS referral number is: [id number]  Wyn Quaker RN

## 2013-06-13 NOTE — Progress Notes (Signed)
2013-06-23 0035  Pt becoming hypotensive again (SBP in 70s, Map in mid 25s). Neo gtt advanced to 34mcg/hr. Informed eLink MD Deterding. No new orders received. Spoke with pt daughters and wife that per the conversation earlier with CCM MD McQuaid we will not give anymore albumin or add any more vasopressors. Family wishes to continue all other treatments at this time, pt DNR if pt arrests. Chaplain called to the bedside.   Wyn Quaker RN

## 2013-06-13 NOTE — Procedures (Signed)
Extubation Procedure Note  Patient Details:   Name: ARVILLE POSTLEWAITE DOB: 1945/08/30 MRN: [id number]   Airway Documentation:     Evaluation  O2 sats: transiently fell during during procedure Complications: No apparent complications Patient did tolerate procedure well. Bilateral Breath Sounds: Clear Suctioning: Airway No  Withdrawal of life performed per MD order.  Philomena Doheny Jun 22, 2013, 4:59 AM

## 2013-06-13 NOTE — Progress Notes (Signed)
Chaplain asked by pt's nurse to visit with pt's family.  Chaplain sat and visited with pt's wife while 1 daughter went to located the other daughter.  Chaplain offered care and support through caring conversation and presence.  Chaplain also obtained beverages for pt's family.     06/07/2013 0200  Clinical Encounter Type  Visited With Patient and family together  Visit Type Spiritual support;Critical Care  Referral From Nurse  Spiritual Encounters  Spiritual Needs Emotional    Estelle June, chaplain pager [phone]

## 2013-06-13 NOTE — Progress Notes (Signed)
07/03/13 0515  Pt family arrived and decided to make pt full comfort care. Morphine gtt infusing and 10mg  bolus given to pt. Pt was terminally extubated at 0458. All vasopressors were turned off at 0458 per family conversation with CCM Rounding MD Siquieros. Pt went asystole and passed at 0506. No heart sounds heard by Wyn Quaker RN. Pt full DNR.   CDS was called back with official time of death. Post mortem care done. Pt Placement notified. Family went home. Family requesting that pt be picked up in room.   40cc of Morphine gtt, 40cc of Versed gtt, and 200cc of Fentanyl gtt all wasted in sink. Witnessed by Anselm Pancoast RN.   Wyn Quaker RN

## 2013-06-13 NOTE — Progress Notes (Signed)
06/08/13 2015  Pt blood pressure beginning to drop again. Wood MD McQuaid notified. Order received for albumin.   Wyn Quaker RN

## 2013-06-13 NOTE — Evaluation (Signed)
This is to document my conversation with Mr. Nordby family about goals of care.  I explained that Mr Duesing is in multiorgan failure and refractory shock secondary to MRSA necrotizing pneumonia. I also explained that his prognosis is extremely poor. The family expressed that he would not want to be on life support and agreed to transition to full comfort care. They would like to wait for more family to come before transitioning to full comfort. We will stop CRRT for now.   Waynetta Pean, MD [place]

## 2013-06-13 DEATH — deceased

## 2013-07-11 ENCOUNTER — Telehealth: Payer: Self-pay

## 2013-07-11 NOTE — Telephone Encounter (Signed)
DS is strictly a hospital-based physician so will be unable to come to the office and we will not be able to take form to him Would recommend seeing if another physician rounded on pt in the hospital and/or did the hospital discharge  Per pt's chart PW, RB and BQ all saw patient with DS doing the discharge.  Dr Lake Bells was the last to document a progress note on 3.27.15 from [facility].  Called spoke with patient's daughter Troy Perez and discussed the situation with her.  She was very understanding and is aware that none of the above physicians are in the office until mid- next week and is okay with this as the form does not have a deadline/due date.  She is aware we will call her once the form has been signed and she requests this be mailed to her mother Troy Perez @ [address]   [place] [id number].  BQ is in the [place] office 5/4, 5/5, 5/6 and back in the [place] office on 5/7.  Dr Lake Bells would you mind filling out/signing this form for pt's family?  I have given it to [place].  Thank you.

## 2013-07-12 NOTE — Telephone Encounter (Signed)
Will forward this message to [facility] to hold until BQ is back in the office next week to fill out form.  thanks

## 2013-07-12 NOTE — Telephone Encounter (Signed)
I'm happy to help but note that I never interviewed nor examined him.  I only helped treat him briefly once through [facility]. Is there a reason why we can't send a courier to the hospital for Dr. Alva Garnet to complete this? If that is not possible I can help when I am in the office again next week.

## 2013-07-17 NOTE — Telephone Encounter (Signed)
Please forward to Dr. Alva Garnet

## 2013-07-19 NOTE — Telephone Encounter (Signed)
Spoke with TD---she is aware that BQ wants the form to be given to DS.   Form given to [facility] and she will deliver this form to DS to be filled out.  Will sign off of this message.

## 2013-07-26 IMAGING — CT CT ABDOMEN WO/W CM
2 of 4 series · 12 of 32 positions shown, 17 images · IV contrast (APPLIED)
Comparison: CT of abdomen 12/01/2010.

CLINICAL DATA: Area of right-sided renal mass status post
cryoablation.  12-month follow-up.  Left-sided renal cancer
diagnosed in 0420 and bladder cancer diagnosed in 5066.
Chemotherapy complete.

CT ABDOMEN WITHOUT AND WITH CONTRAST
TECHNIQUE: Multidetector CT imaging of the abdomen was performed
following the standard protocol before and during bolus
administration of intravenous contrast.
Contrast: 100mL OMNIPAQUE IOHEXOL 300 MG/ML IJ SOLN

[Series 2: abd/pel w/o · axial · non-contrast · 0.74mm/px · z∈[+1254,+1404]mm · 6 of 70 slices shown, 11 images]
[im 10/70  soft-tissue]
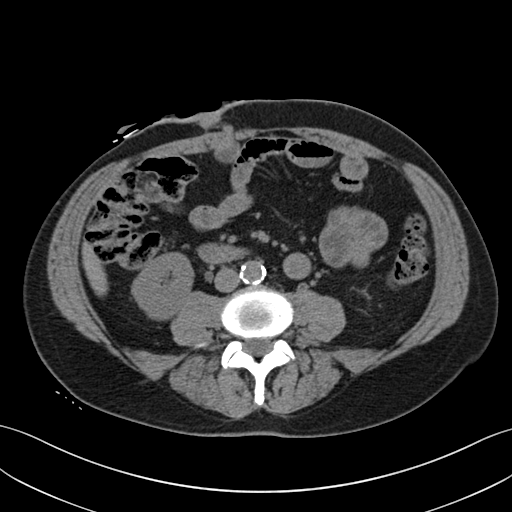
[im 10/70  bone]
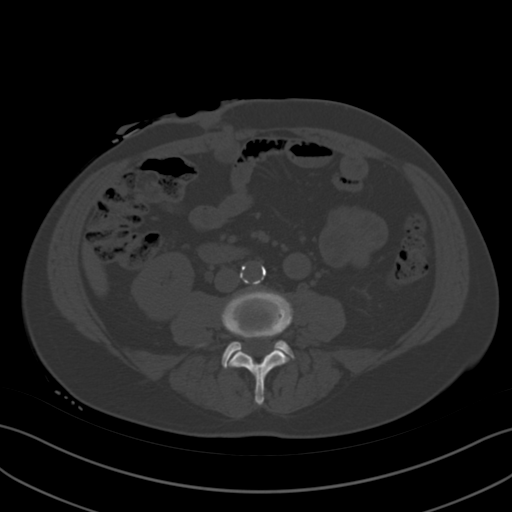
[im 20/70  soft-tissue]
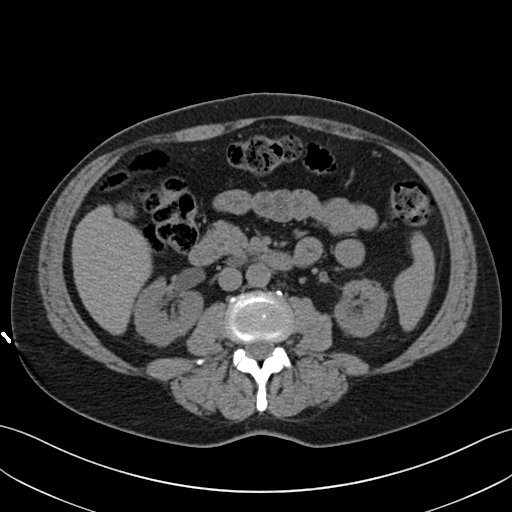
[im 30/70  soft-tissue]
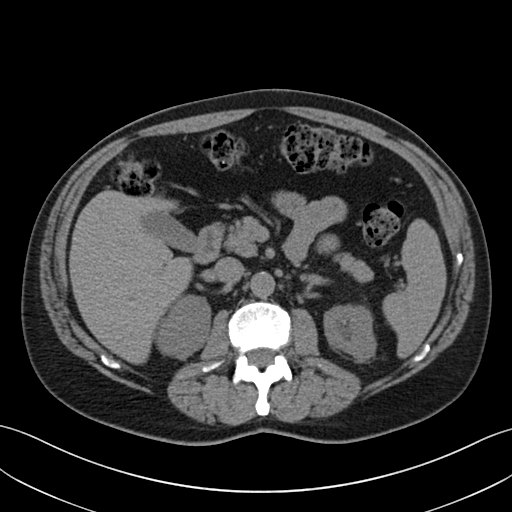
[im 30/70  lung]
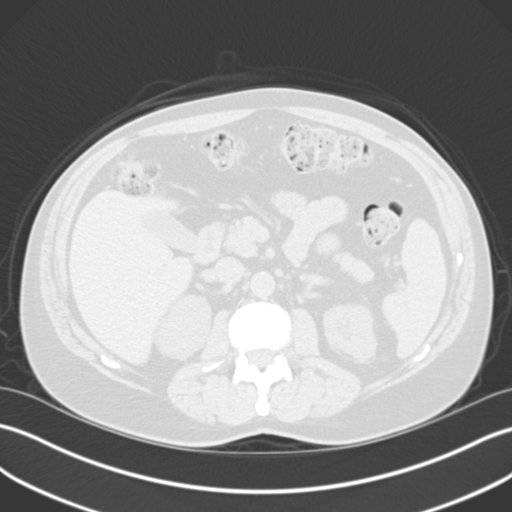
[im 40/70  soft-tissue]
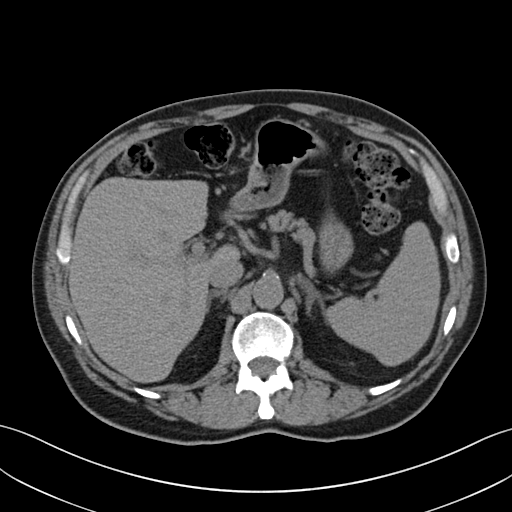
[im 40/70  lung]
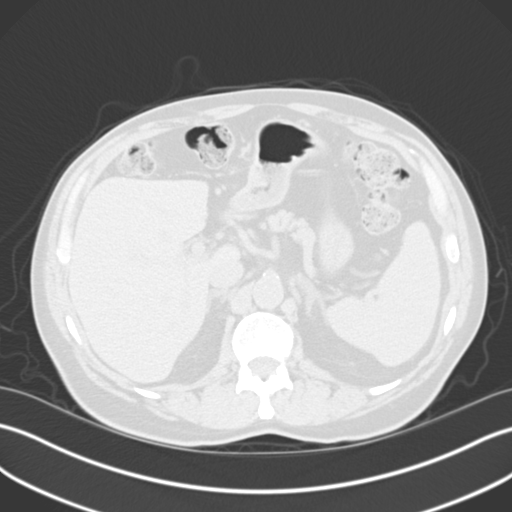
[im 50/70  soft-tissue]
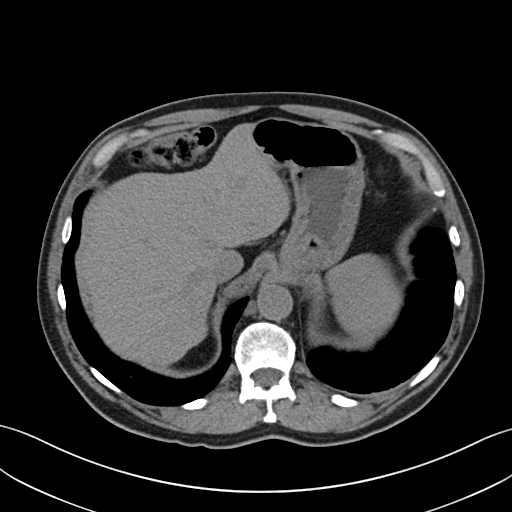
[im 50/70  lung]
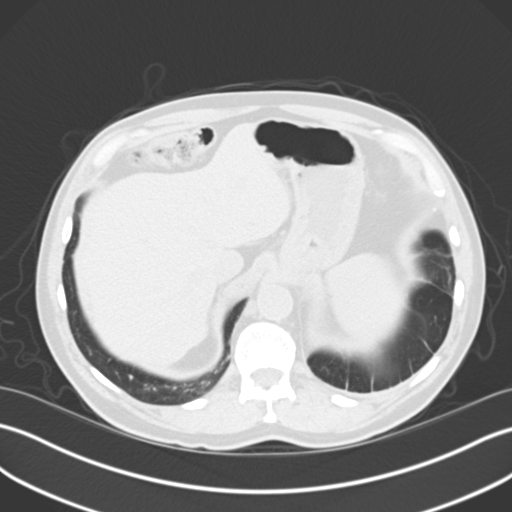
[im 60/70  soft-tissue]
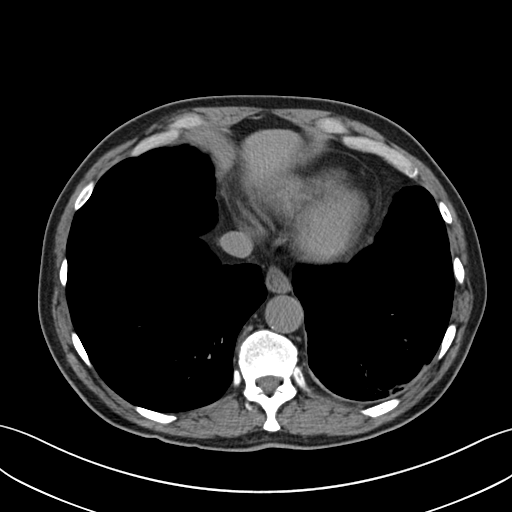
[im 60/70  lung]
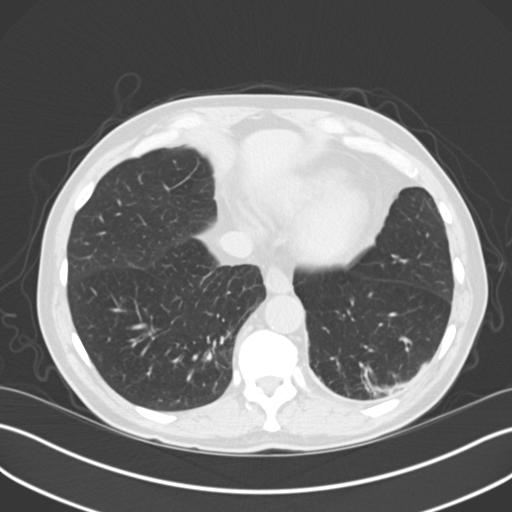

[Series 5: arterial phase · axial · arterial · 0.74mm/px · z∈[+1256,+1403]mm · 6 of 69 slices shown]
[im 10/69  soft-tissue]
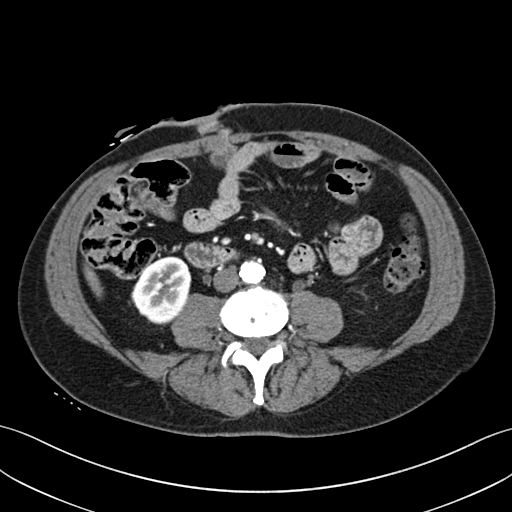
[im 20/69  soft-tissue]
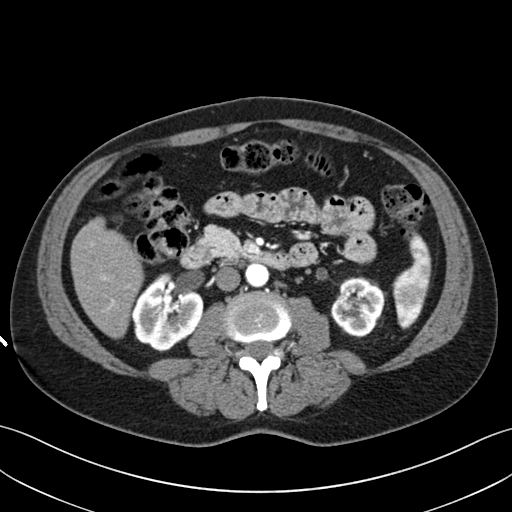
[im 30/69  soft-tissue]
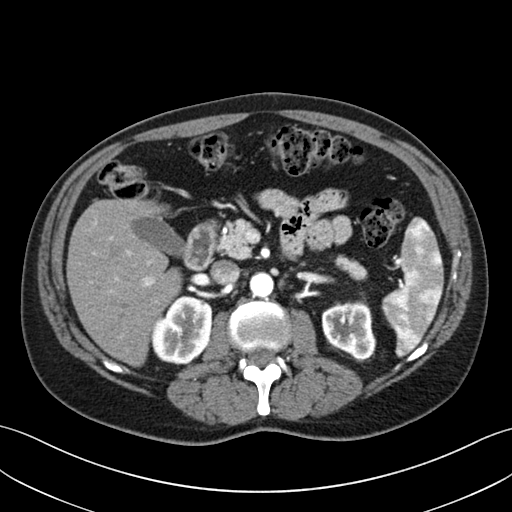
[im 39/69  soft-tissue]
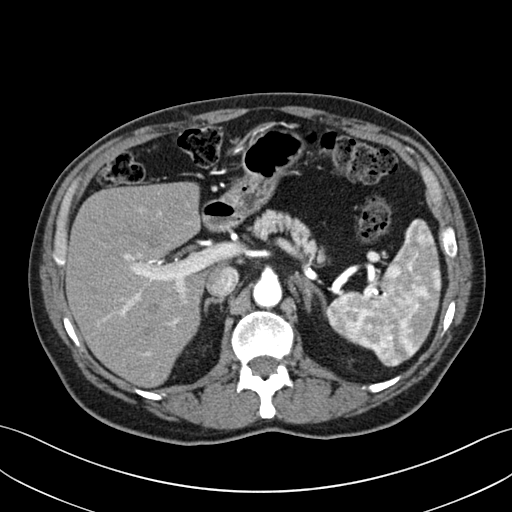
[im 49/69  soft-tissue]
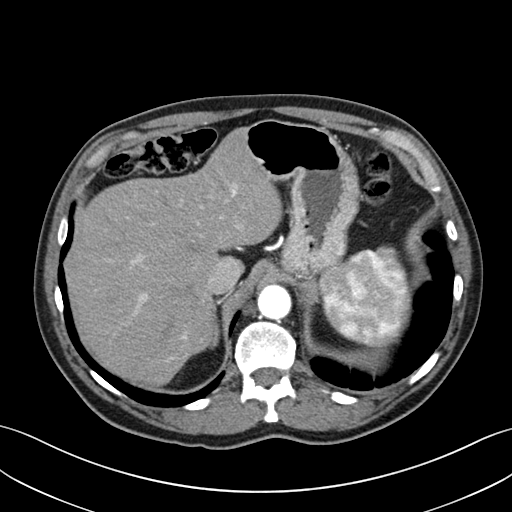
[im 59/69  soft-tissue]
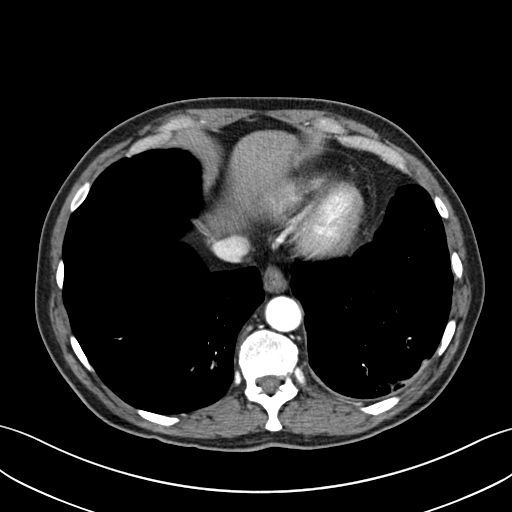

[12 of 32 positions shown; findings below may reference images not displayed]

FINDINGS: Lung Bases: There is a peripheral pleural based opacity in the base
of the left lower lobe dependently that is unchanged compared to
prior examinations, most consistent with an area of scarring or
developing rounded atelectasis.  Similar linear scarring in the
periphery of the inferior segment lingula is also noted.  Mild
bilateral lower lobe bronchial wall thickening (similar priors).
No definite suspicious appearing pulmonary nodules or masses in the
visualized lung bases.

Abdomen: Post procedural changes of radiofrequency ablation in the
posterior aspect of the interpolar region of the right kidney are
again noted.  On precontrast images, there is a small focus of
increased attenuation in this region measuring less than a
centimeter, likely to represent some proteinaceous debris or a
partially calcified area of scarring.  However, on the arterial
phase images (image 46 of series 5), there is a well-defined area
of hyperenhancement measuring 11 x 9 mm, which is unchanged in size
and appearance compared to prior study from 05/25/2010.  No other
suspicious abnormality in the right kidney or surrounding the right
retroperitoneum is noted. A 2 mm low attenuation lesion in the
upper pole of the right kidney is unchanged compared to priors, and
although too small to characterize statistically likely represents
a tiny cyst.

The contralateral kidney is atrophic.  The unenhanced and enhanced
appearance of the liver, gallbladder, pancreas, spleen and right
adrenal gland is unremarkable.  There is some mild adreniform
thickening of the left adrenal gland which is unchanged compared to
prior studies, and may reflect adrenal hyperplasia.  A right lower
quadrant ostomy is noted (incompletely visualized).  No pathologic
distension of bowel.  No definite pathologic adenopathy noted
within the visualized portions of the abdomen.  No significant
volume of ascites.  Atherosclerosis of the abdominal vasculature,
without evidence of aneurysm.

Musculoskeletal: There are no aggressive appearing lytic or blastic
lesions noted in the visualized portions of the skeleton.
IMPRESSION: 1.  Today's study demonstrates a persistent focus of
hyperenhancement in the posterior aspect of the interpolar region
of the right kidney at site of prior cryoablation.  However, this
has not changed in size over 1-year time interval.  Under normal
circumstances, this finding would be considered concerning for
potential residual disease, however, the stability over a 1-year
time interval suggests against an aggressive lesion, and a focus of
extensive granulation tissue, or a small pseudoaneurysm is more
strongly favored on today's examination.  Continued follow up on
future studies is recommended.
2.  Mild atrophy of the left kidney is again noted.
3.  Scarring in the lung bases bilaterally, as above, similar to
priors.

## 2013-08-01 ENCOUNTER — Telehealth: Payer: Self-pay | Admitting: Pulmonary Disease

## 2013-08-01 NOTE — Telephone Encounter (Signed)
Have you heard anything from dr simmonds a/b ins papers.Troy Perez

## 2013-09-20 ENCOUNTER — Ambulatory Visit: Payer: Medicare Other | Admitting: Oncology

## 2013-09-20 ENCOUNTER — Other Ambulatory Visit: Payer: Medicare Other
